# Patient Record
Sex: Female | Born: 1975 | Hispanic: Yes | Marital: Married | Smoking: Never smoker
Health system: Southern US, Community
[De-identification: ages and names within clinical notes are randomized; demographics above are authoritative.]

## PROBLEM LIST (undated history)

## (undated) DIAGNOSIS — F410 Panic disorder [episodic paroxysmal anxiety] without agoraphobia: Secondary | ICD-10-CM

## (undated) DIAGNOSIS — F419 Anxiety disorder, unspecified: Secondary | ICD-10-CM

## (undated) HISTORY — PX: TUBAL LIGATION: SHX77

---

## 2006-01-01 ENCOUNTER — Emergency Department: Payer: Self-pay | Admitting: General Practice

## 2006-01-01 IMAGING — CT CT HEAD WITHOUT CONTRAST
2 of 4 series · 17 of 30 positions shown, 20 images · non-contrast
Comparison: none

REASON FOR EXAM: dizziness, near syncope
COMMENTS:

[Series 4: without · axial · non-contrast · 0.39mm/px · z∈[-192,-62]mm · 11 of 32 slices shown, 14 images]
[im 3/32  brain]
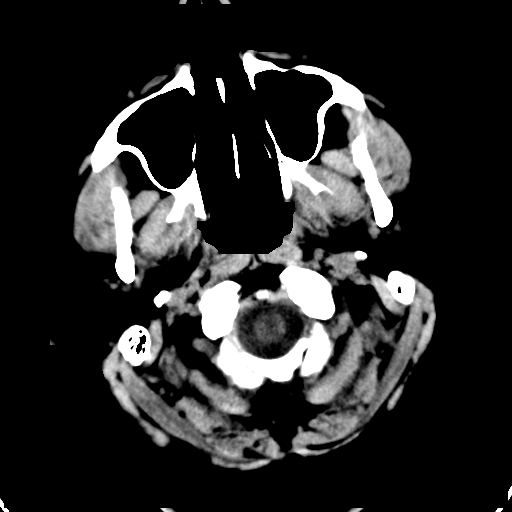
[im 3/32  bone]
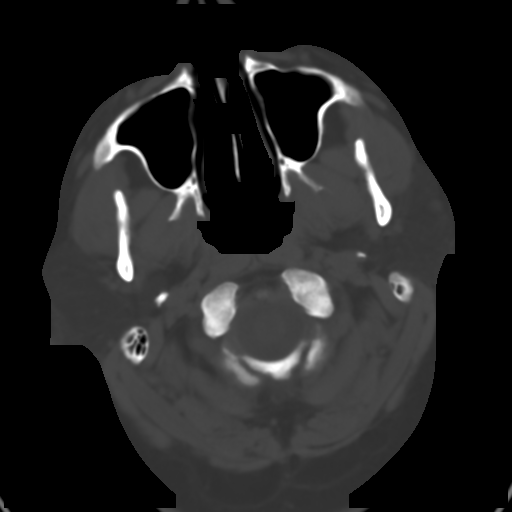
[im 6/32  brain]
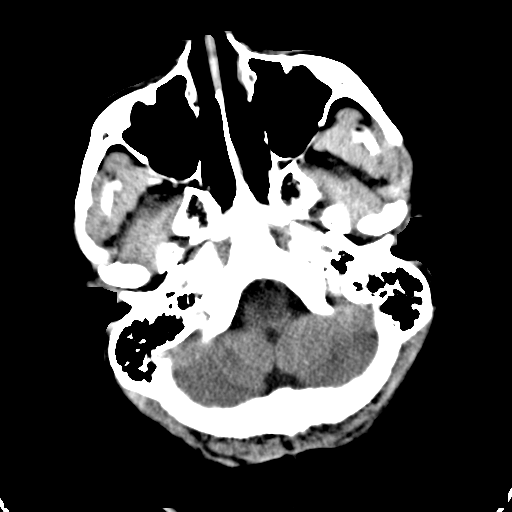
[im 8/32  brain]
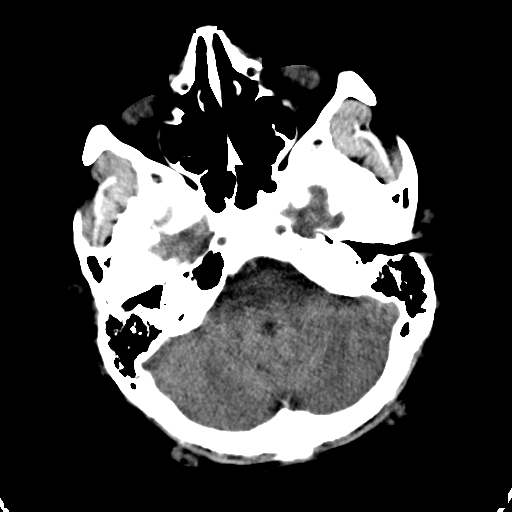
[im 11/32  brain]
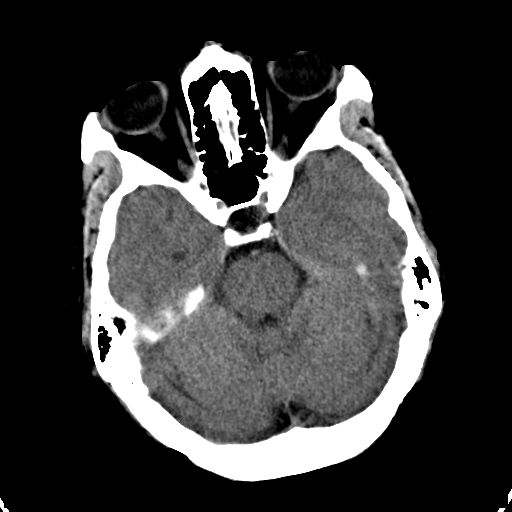
[im 13/32  brain]
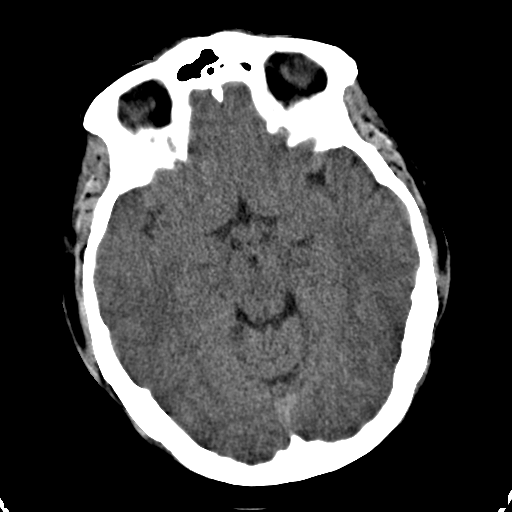
[im 13/32  bone]
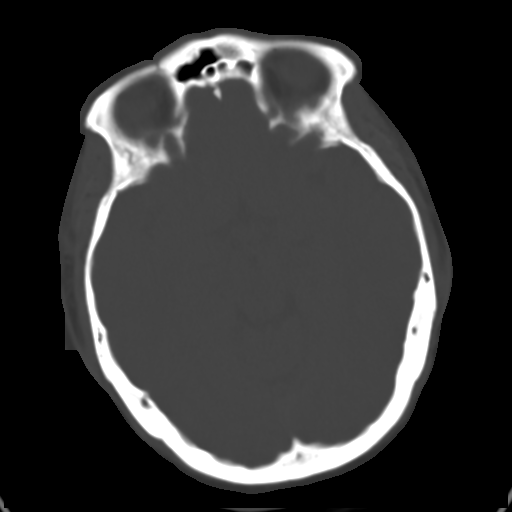
[im 16/32  brain]
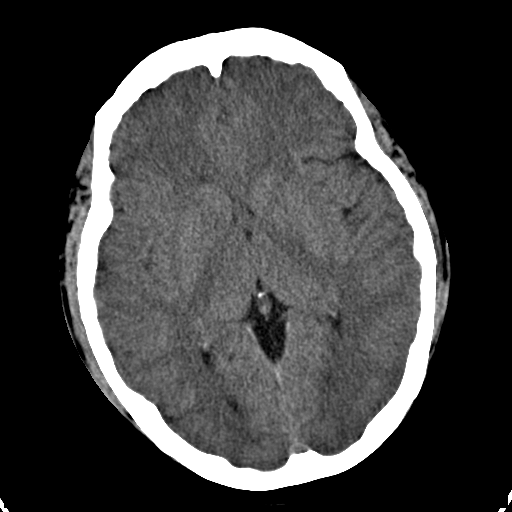
[im 19/32  brain]
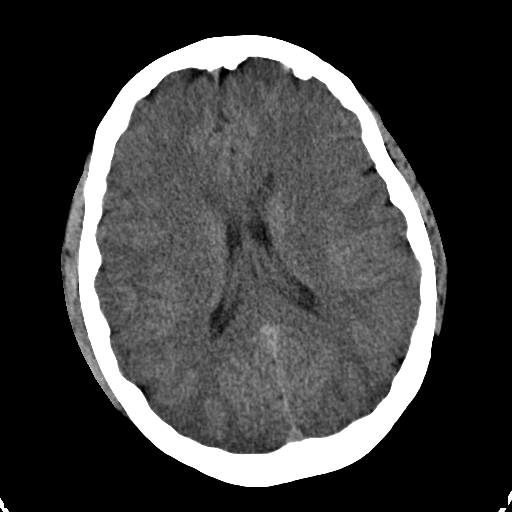
[im 21/32  brain]
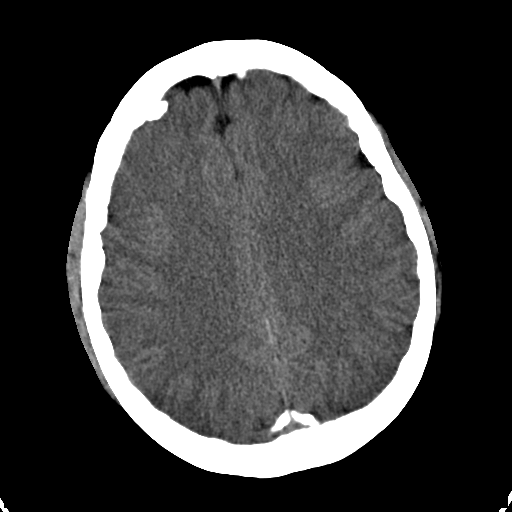
[im 24/32  brain]
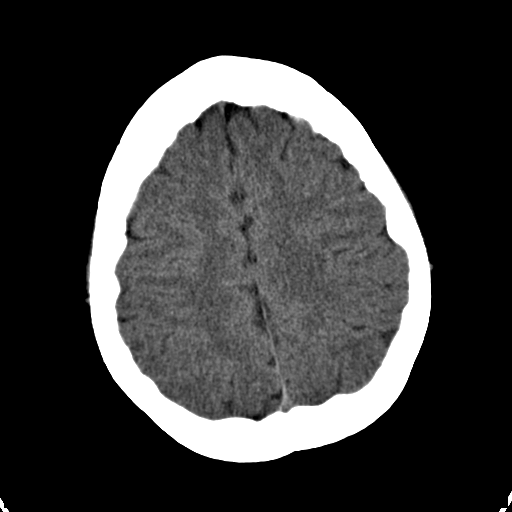
[im 24/32  bone]
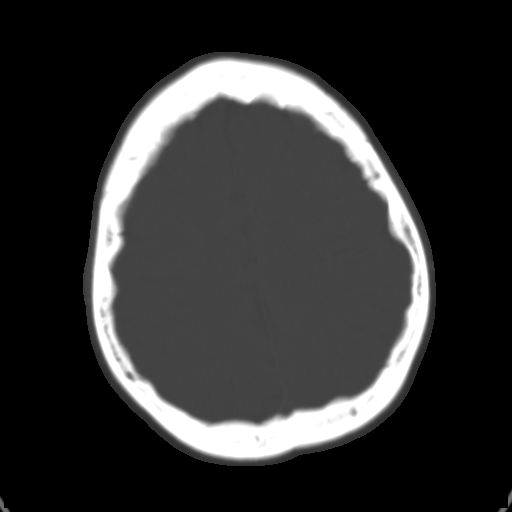
[im 26/32  brain]
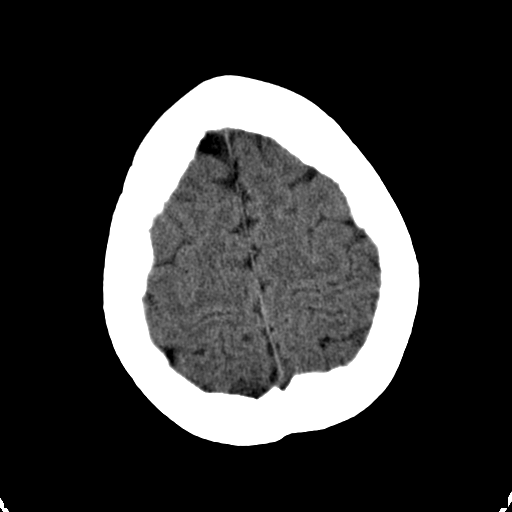
[im 29/32  brain]
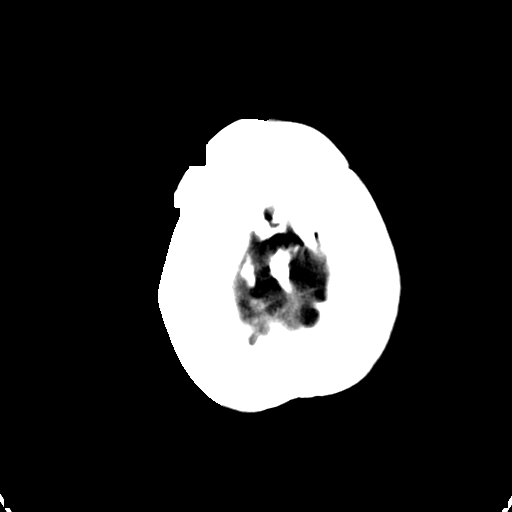

[Series 5: bone · axial · 0.39mm/px · z∈[-192,-102]mm · 6 of 32 slices shown]
[im 3/32  bone]
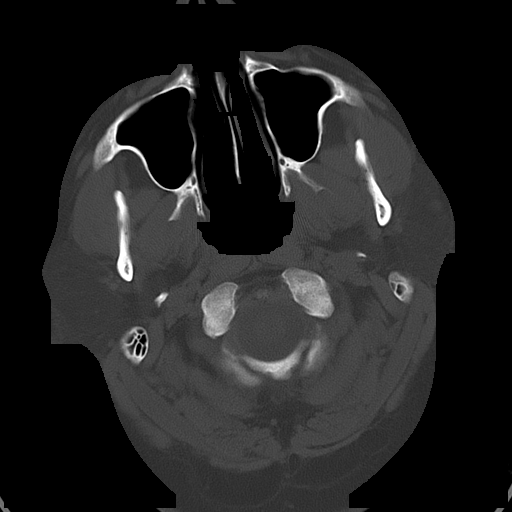
[im 8/32  bone]
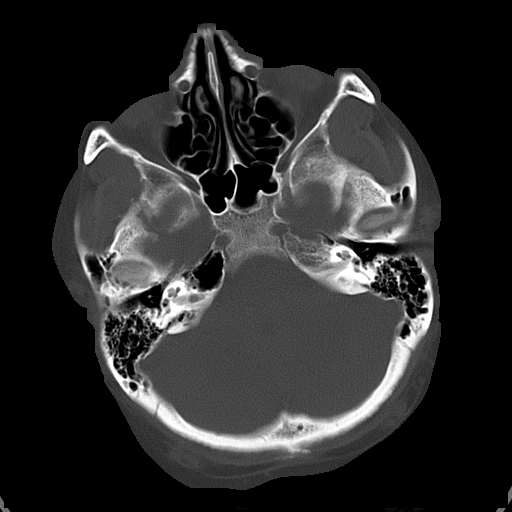
[im 11/32  bone]
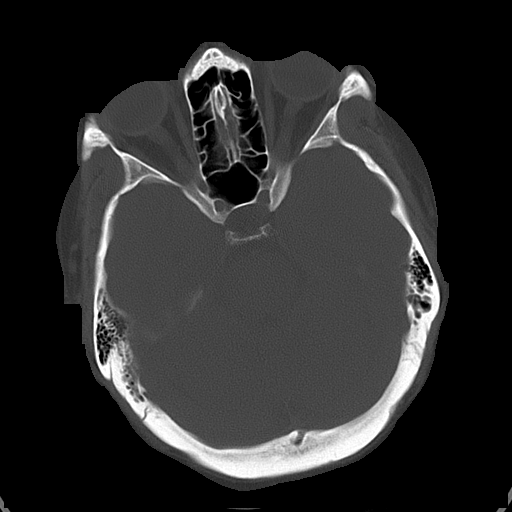
[im 13/32  bone]
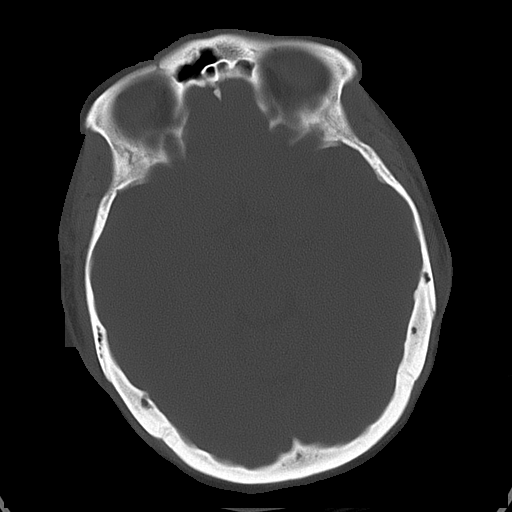
[im 19/32  bone]
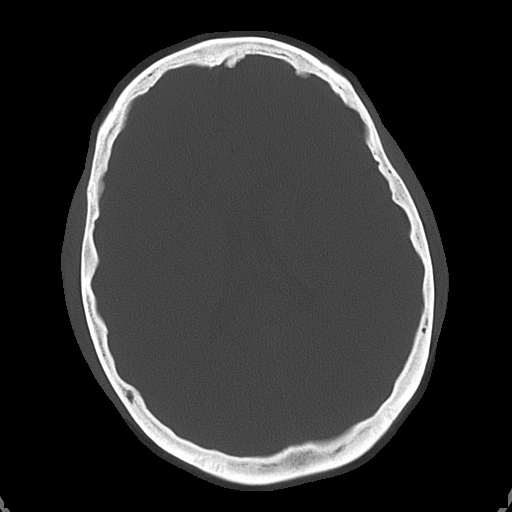
[im 21/32  bone]
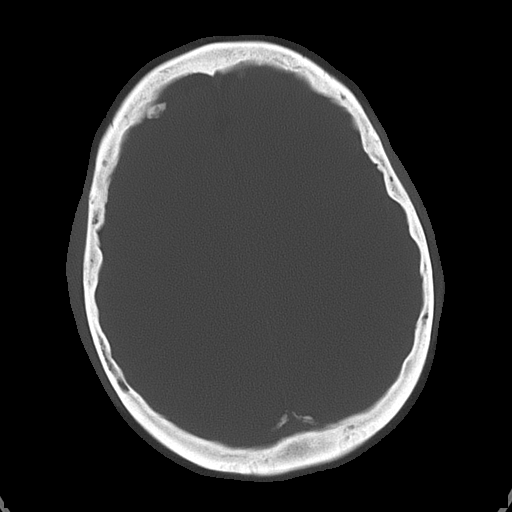

[17 of 30 positions shown; findings below may reference images not displayed]

PROCEDURE:     CT  - CT HEAD WITHOUT CONTRAST  - January 01, 2006  [DATE]

RESULT:          The patient is complaining of two days of headache.

The ventricles are normal in size and position.  There is no intracranial
hemorrhage, mass, or mass effect.  At bone window settings, I do not see
evidence of an acute skull fracture.  I see no air-fluid levels in the
visualized portions of the paranasal sinuses.  No lytic or blastic bony
lesion is seen.
IMPRESSION: I see no acute intracranial abnormality.

The findings were called to the emergency room at the conclusion of the
study.

## 2006-01-01 IMAGING — CR DG CHEST 2V
1 series · 2 of 2 positions shown · non-contrast
Comparison: none

REASON FOR EXAM: shortness of breath
COMMENTS:

[Series 1: view not recorded · 0.17mm/px · 2 of 2 slices shown]
[im 1/2]
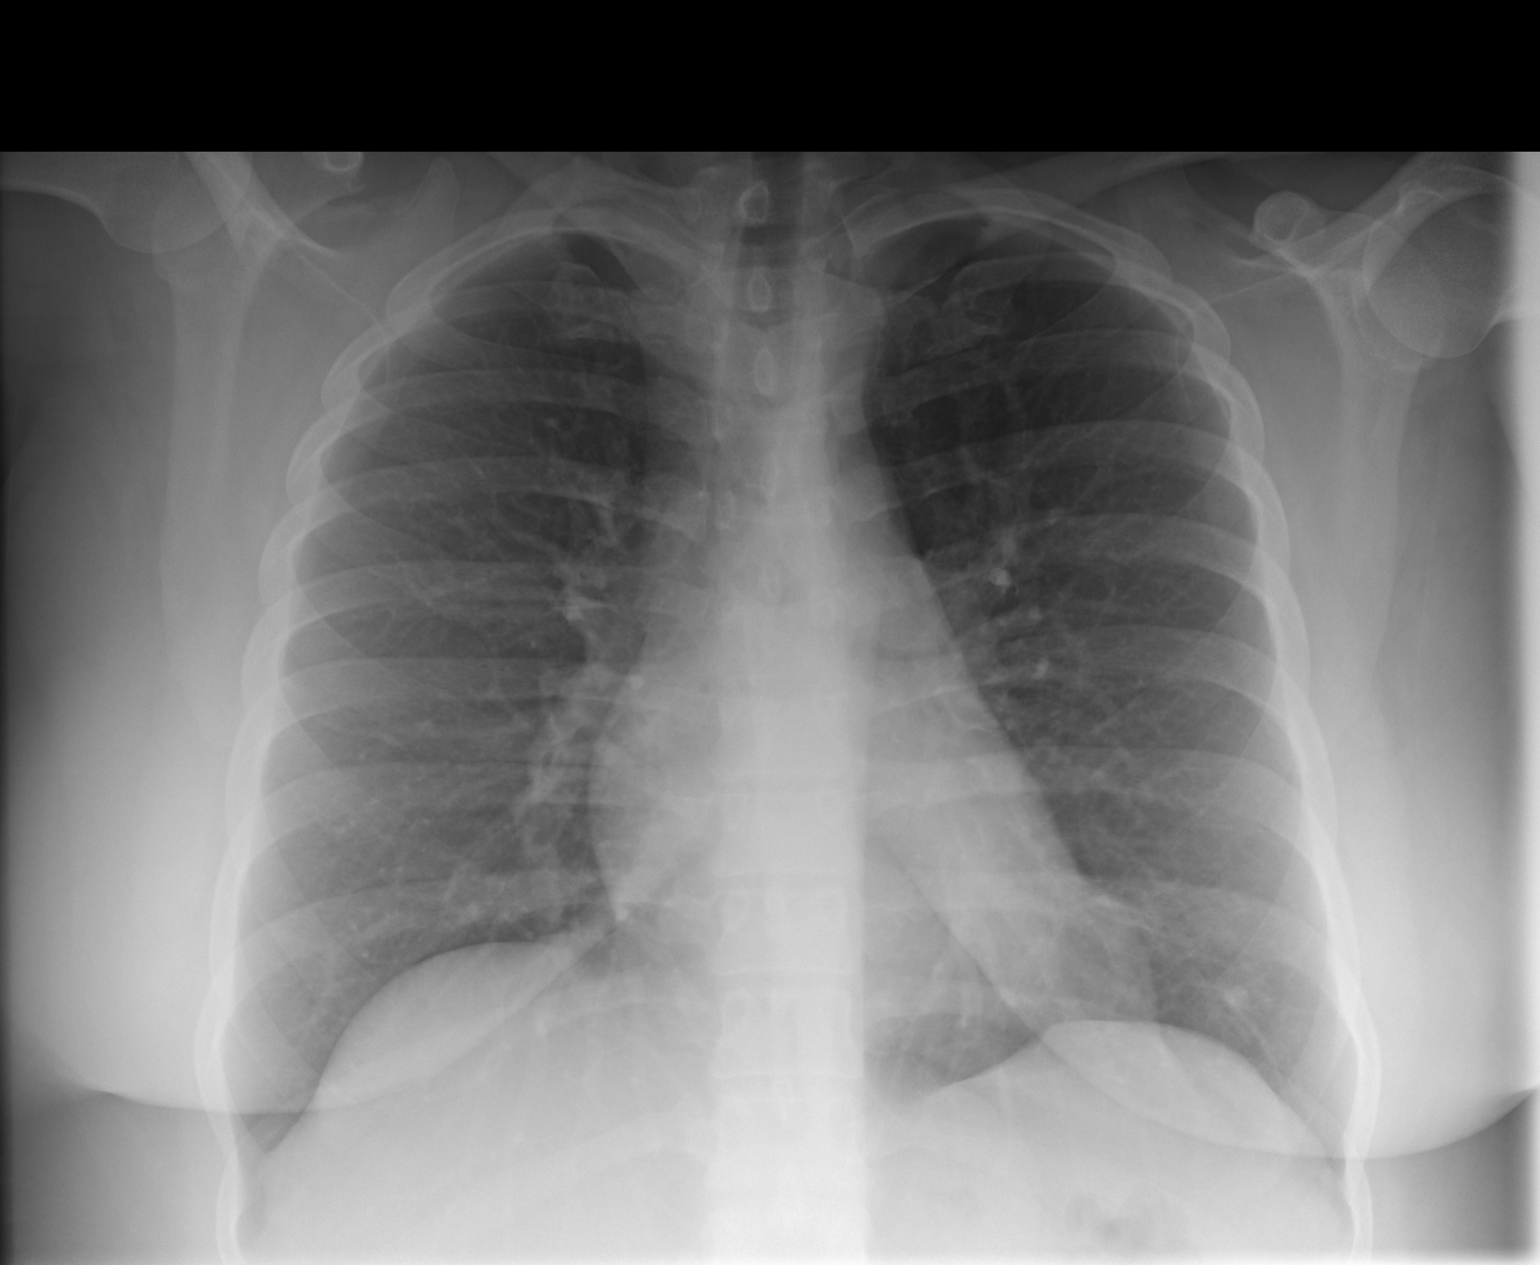
[im 2/2]
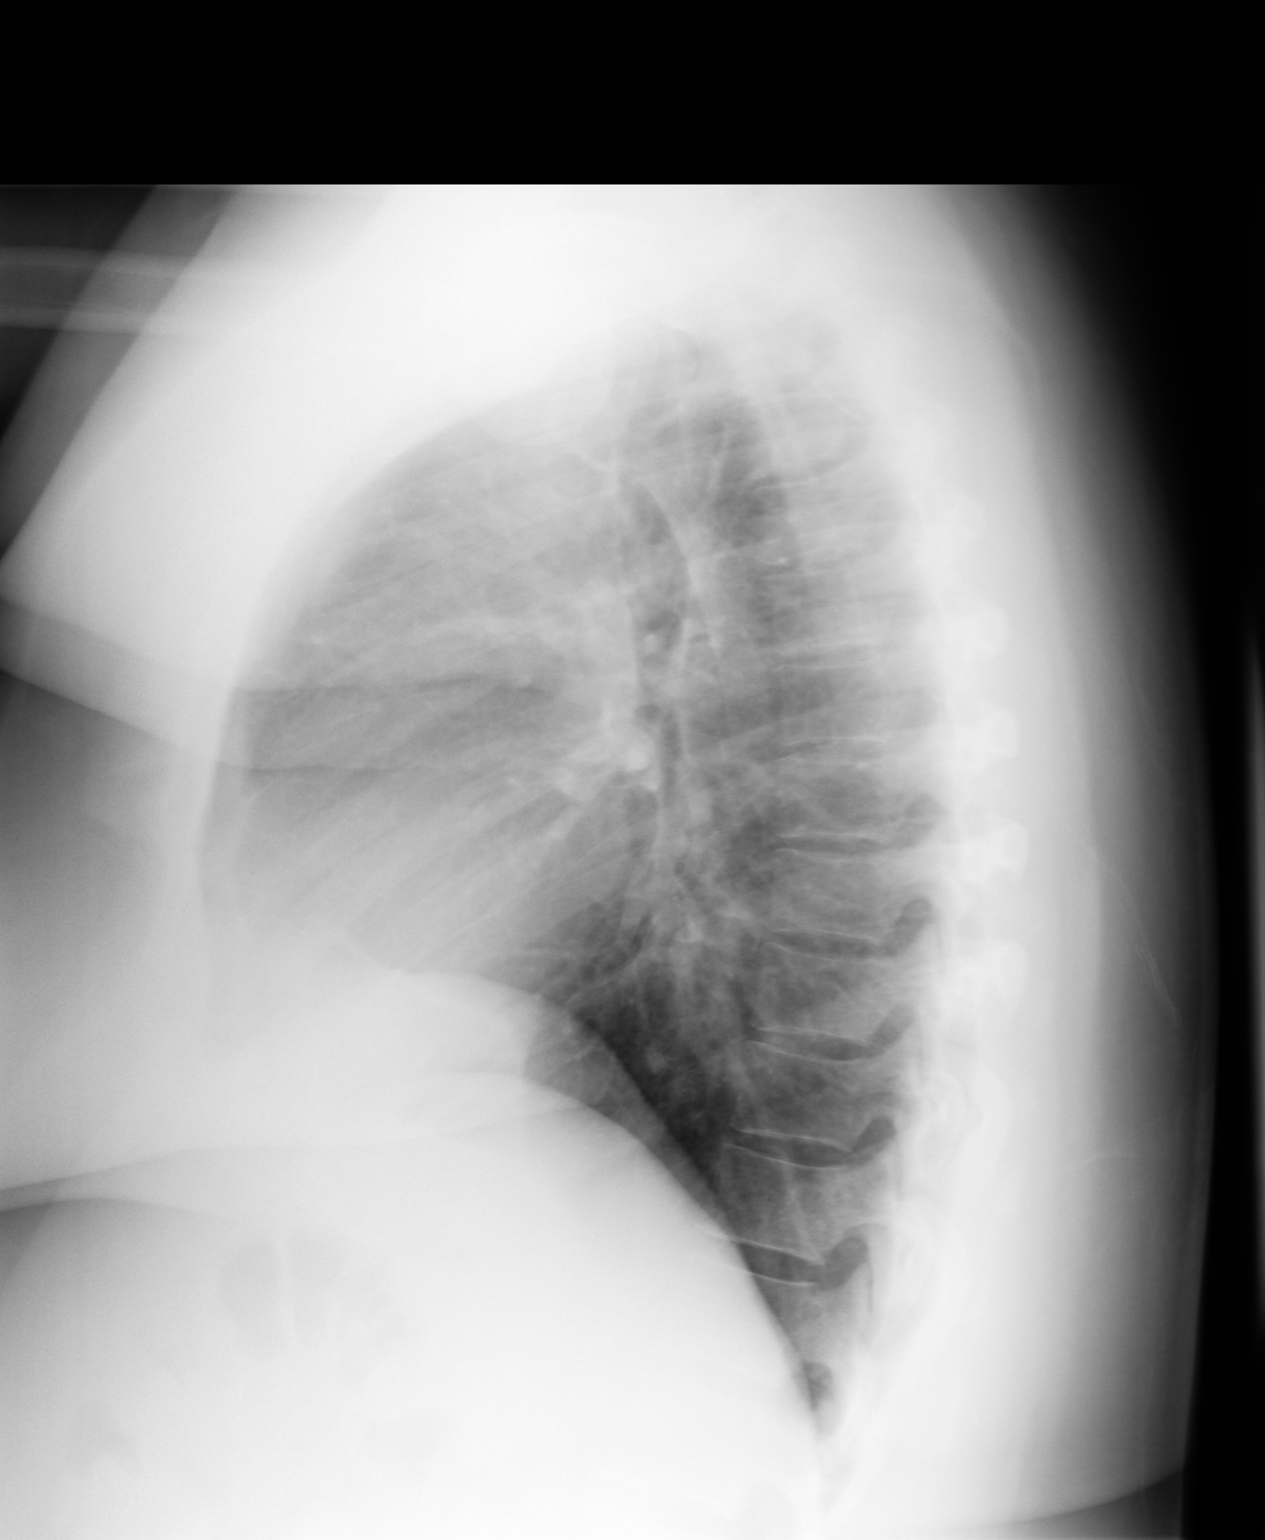

[2 of 2 positions shown; findings below may reference images not displayed]

PROCEDURE:     DXR - DXR CHEST PA (OR AP) AND LATERAL  - January 01, 2006  [DATE]

RESULT:          The lungs are adequately inflated.  There is no focal
infiltrate.  A radiodensity projecting in the LEFT lower lobe is most
compatible with a granuloma.  The heart is not enlarged, and the pulmonary
vascularity is not engorged.
IMPRESSION: I do not see evidence of congestive heart failure, nor
pneumonia, nor other acute cardiopulmonary disease.  There are findings
which likely reflect prior granulomatous infection.

## 2006-11-05 ENCOUNTER — Other Ambulatory Visit: Payer: Self-pay

## 2006-11-05 ENCOUNTER — Emergency Department: Payer: Self-pay | Admitting: Emergency Medicine

## 2006-12-31 ENCOUNTER — Emergency Department: Payer: Self-pay | Admitting: Emergency Medicine

## 2006-12-31 IMAGING — CR CERVICAL SPINE - COMPLETE 4+ VIEW
1 series · 6 of 6 positions shown · non-contrast
Comparison: none

REASON FOR EXAM: Left sided pain with radicular symtoms to left arm
COMMENTS:

[Series 1: view not recorded · 0.17mm/px · 6 of 6 slices shown]
[im 1/6]
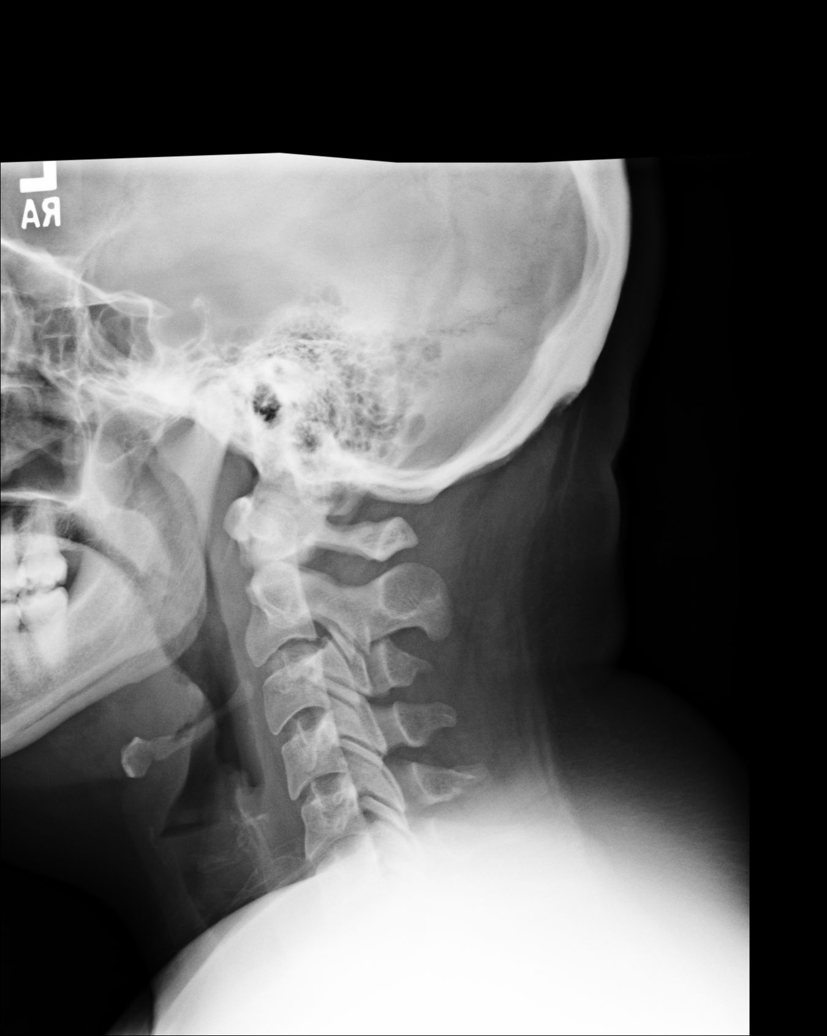
[im 2/6]
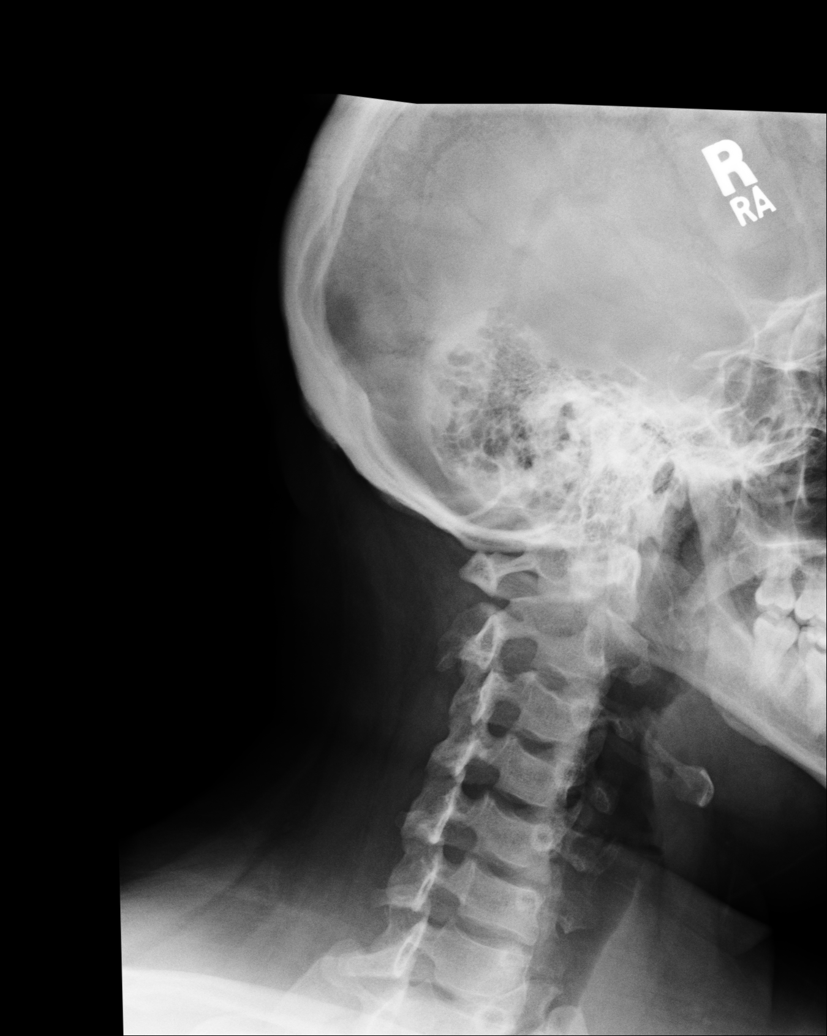
[im 3/6]
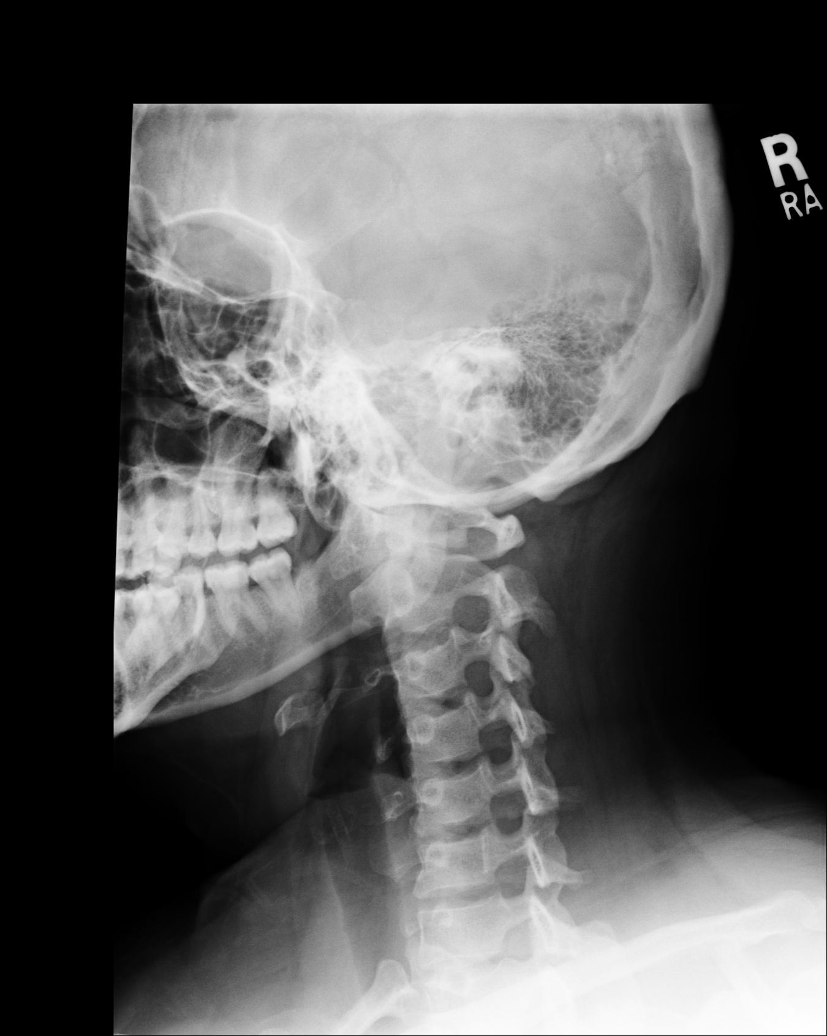
[im 4/6]
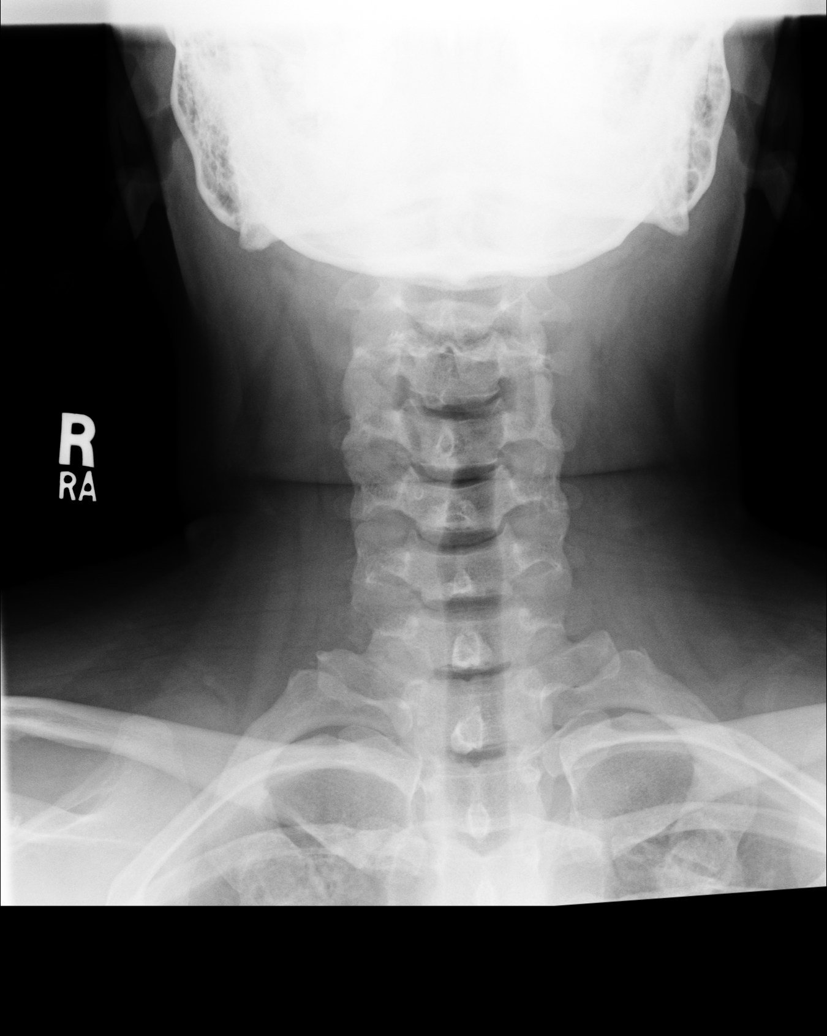
[im 5/6]
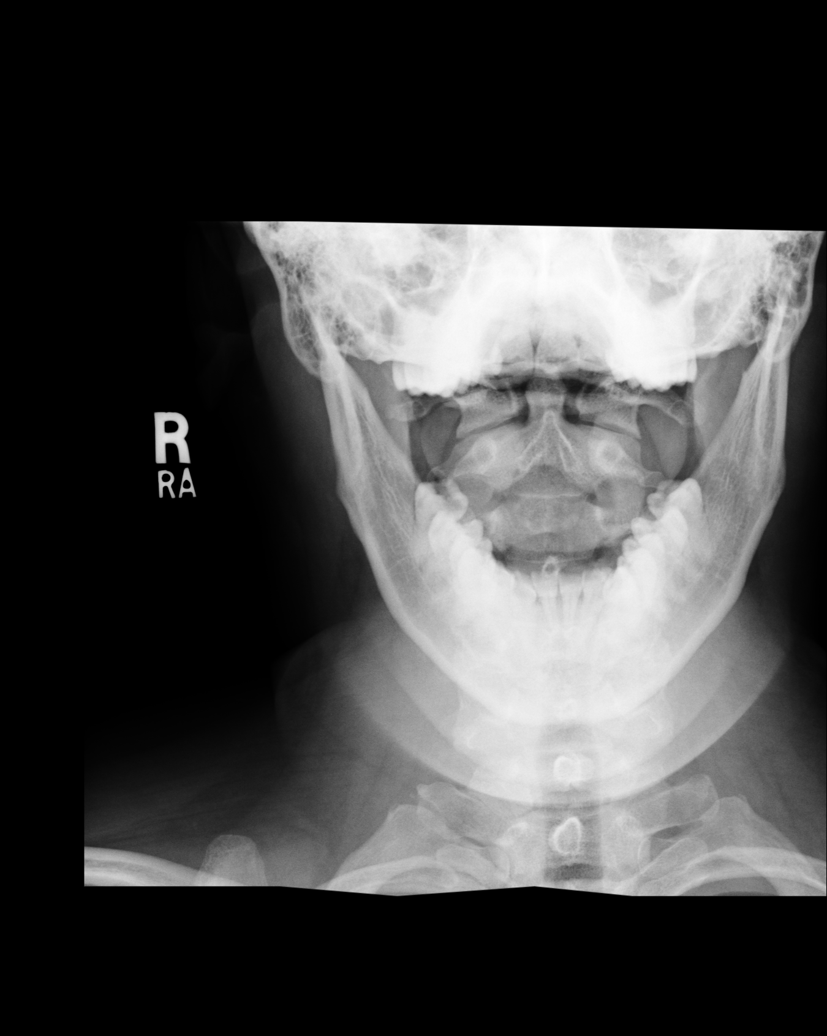
[im 6/6]
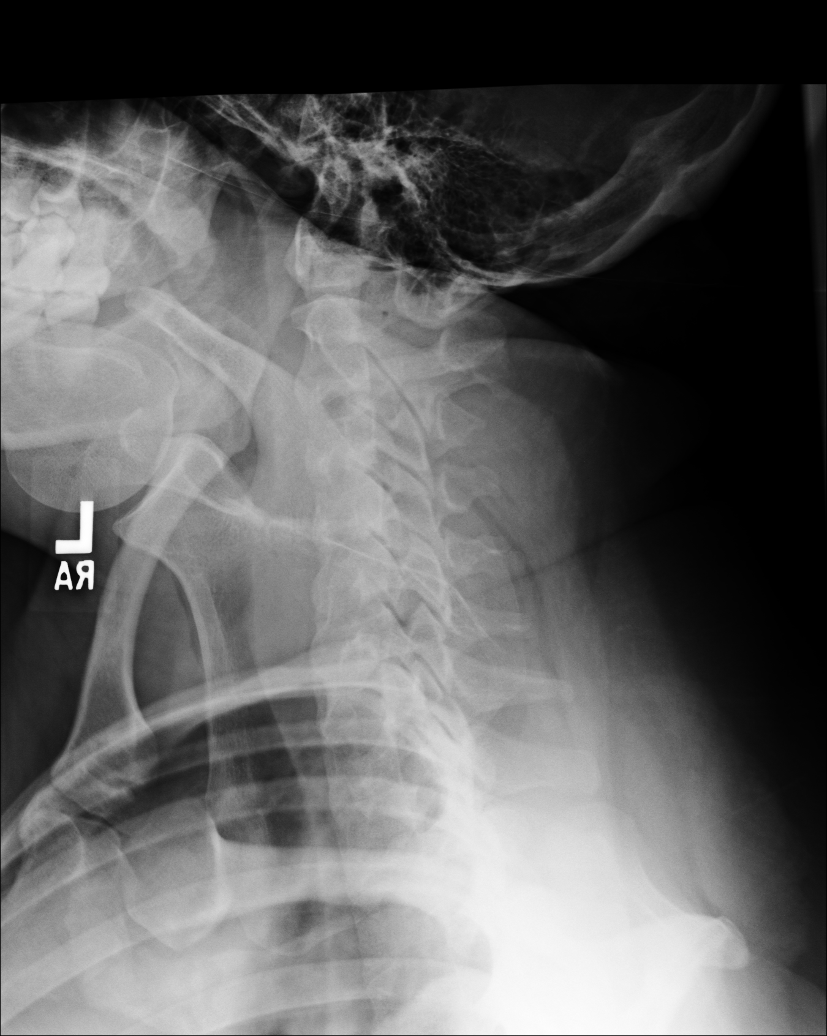

[6 of 6 positions shown; findings below may reference images not displayed]

PROCEDURE:     DXR - DXR CERVICAL SPINE COMPLETE  - December 31, 2006  [DATE]

RESULT:     Images of the cervical spine demonstrate loss of the normal
cervical lordosis. The odontoid is intact. The cervicothoracic region and
craniocervical junction appear to be normal. There is loss of the normal
cervical lordosis which can be seen in muscle spasm. The prevertebral soft
tissues are normal.
IMPRESSION: No evidence of fracture.

## 2008-10-29 ENCOUNTER — Emergency Department: Payer: Self-pay | Admitting: Emergency Medicine

## 2009-10-25 ENCOUNTER — Emergency Department: Payer: Self-pay | Admitting: Emergency Medicine

## 2009-10-25 IMAGING — CR RIGHT ELBOW - COMPLETE 3+ VIEW
1 series · 4 of 4 positions shown · non-contrast
Comparison: none

REASON FOR EXAM: fall / pain / decreased movement
COMMENTS:   May transport without cardiac monitor

[Series 1: view not recorded · 0.17mm/px · 4 of 4 slices shown]
[im 1/4]
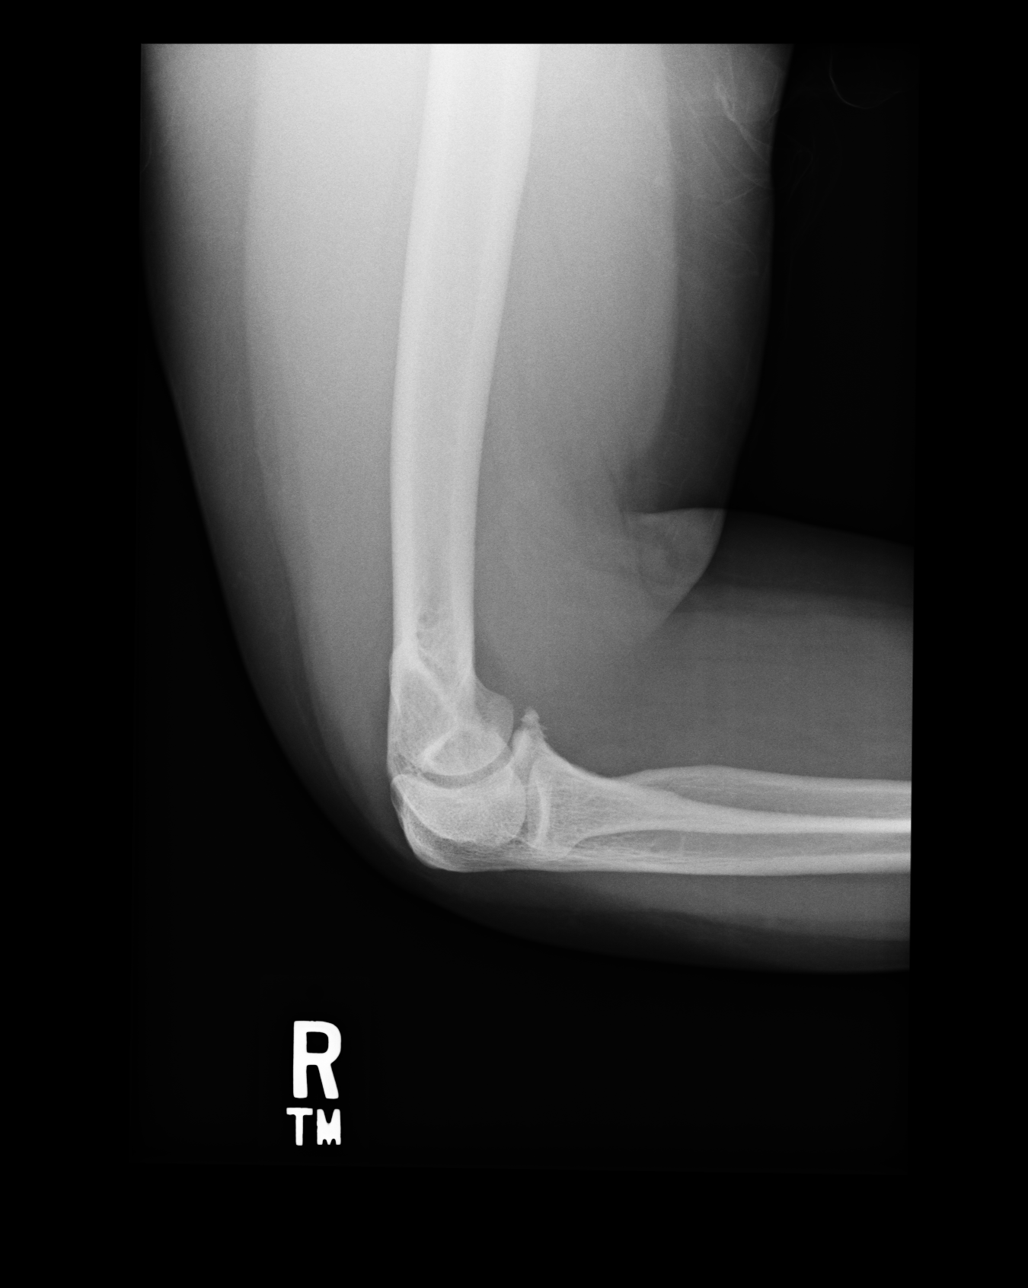
[im 2/4]
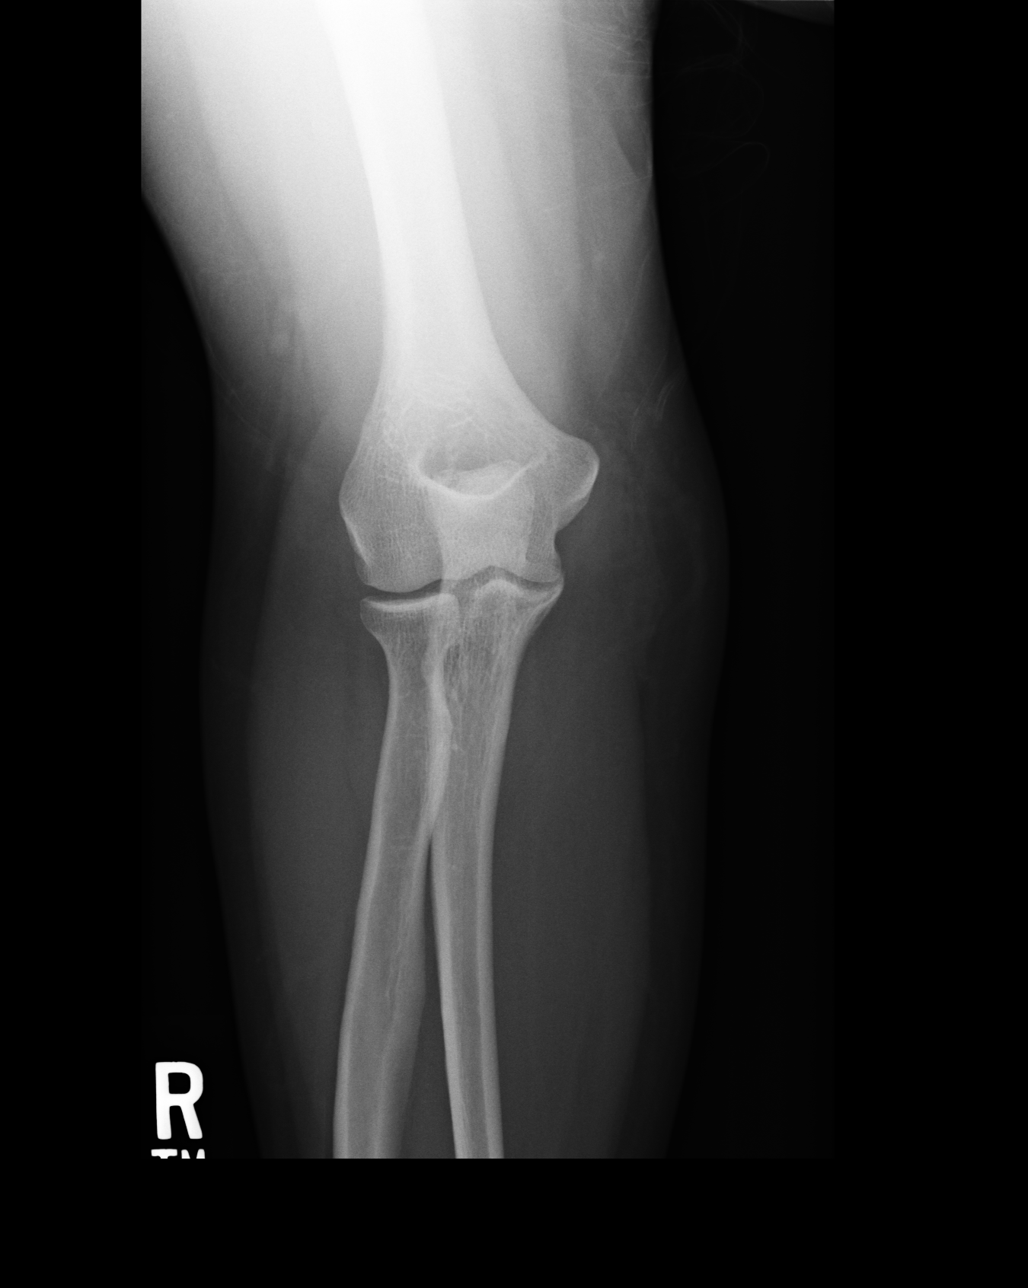
[im 3/4]
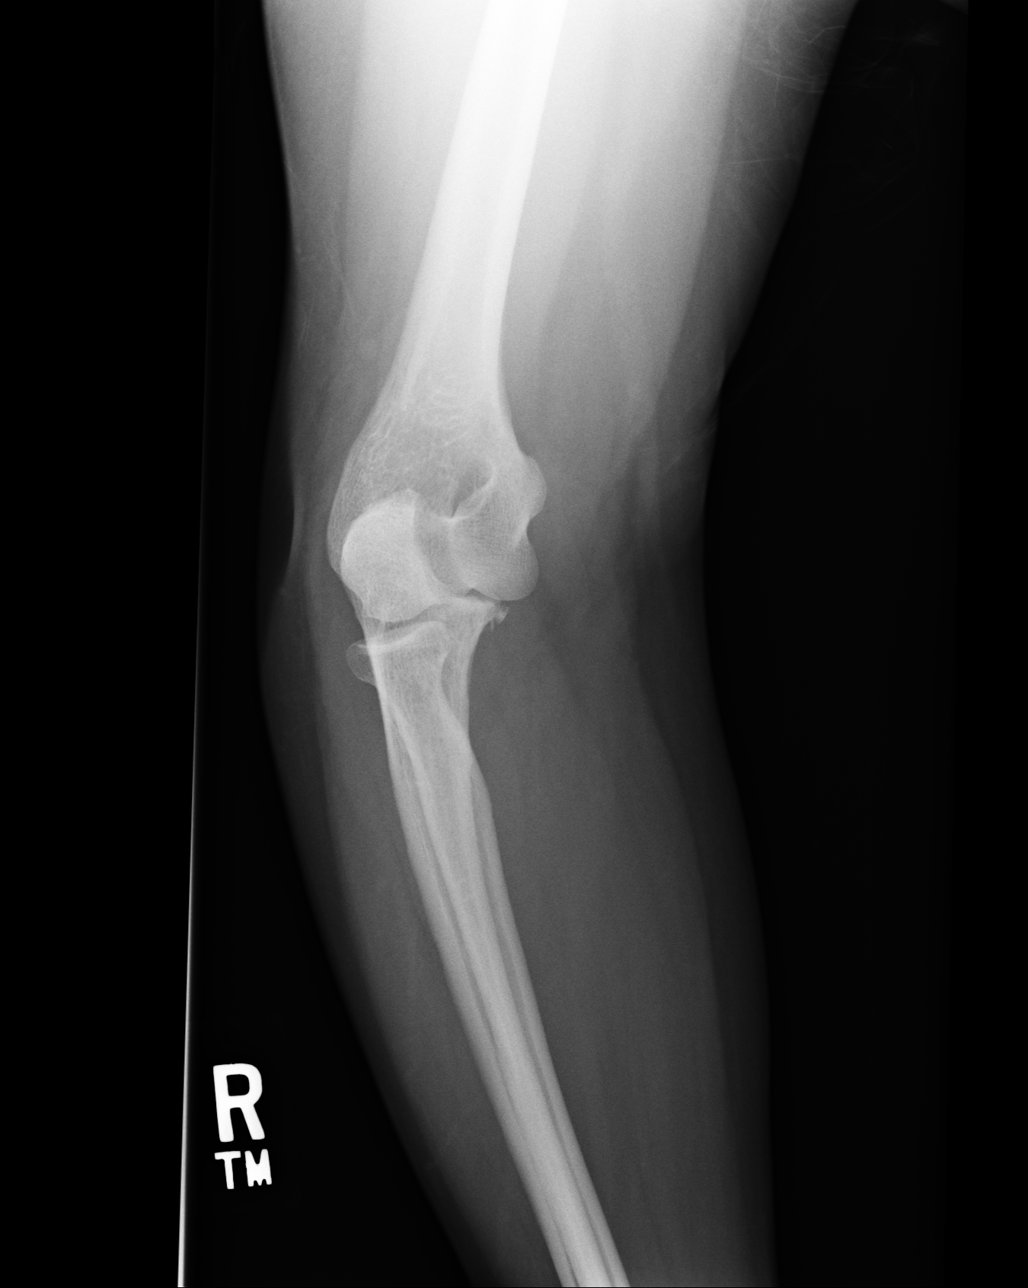
[im 4/4]
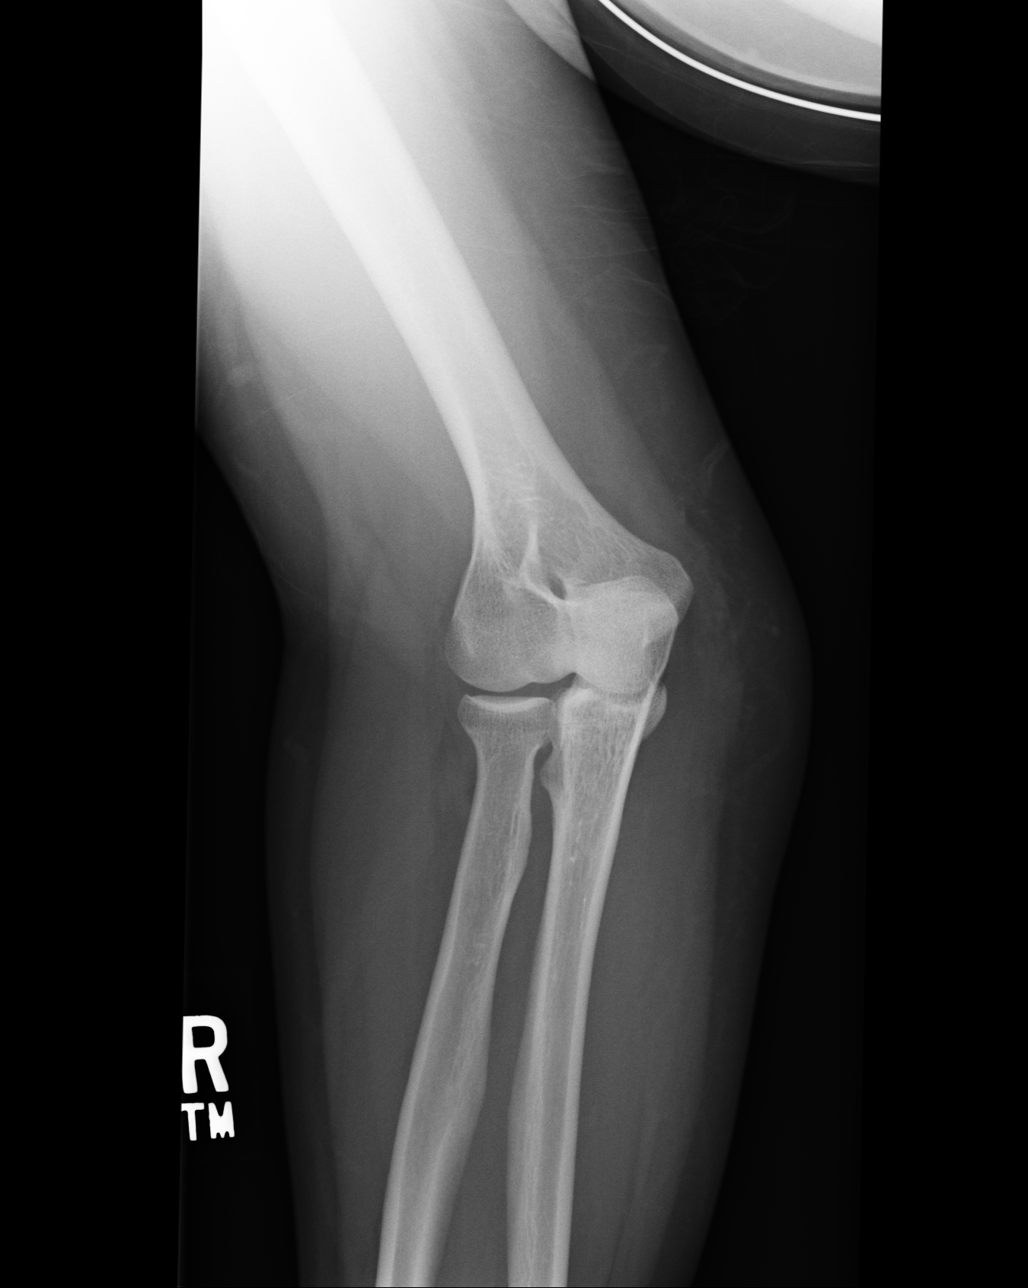

[4 of 4 positions shown; findings below may reference images not displayed]

PROCEDURE:     DXR - DXR ELBOW RT COMP W/OBLIQUES  - October 25, 2009  [DATE]

RESULT:     Four views of the right elbow are submitted. The radial head is
intact. I see no supracondylar fracture. There is a small joint effusion.
There is irregularity of the coronoid process of the olecranon. This may be
degenerative but a tiny avulsion fracture cannot be excluded.
IMPRESSION: I cannot exclude an avulsion from the coronoid of the
olecranon. There is a small joint effusion. I see no radial head fracture or
supracondylar fracture of the humerus.

## 2011-10-26 ENCOUNTER — Emergency Department: Payer: Self-pay | Admitting: *Deleted

## 2011-10-26 LAB — CBC
HCT: 38.9 % (ref 35.0–47.0)
MCH: 29.1 pg (ref 26.0–34.0)
MCHC: 34.2 g/dL (ref 32.0–36.0)
MCV: 85 fL (ref 80–100)
RDW: 13.6 % (ref 11.5–14.5)

## 2011-10-26 LAB — COMPREHENSIVE METABOLIC PANEL
Albumin: 3.4 g/dL (ref 3.4–5.0)
Anion Gap: 7 (ref 7–16)
BUN: 12 mg/dL (ref 7–18)
Bilirubin,Total: 0.3 mg/dL (ref 0.2–1.0)
Calcium, Total: 8 mg/dL — ABNORMAL LOW (ref 8.5–10.1)
Co2: 27 mmol/L (ref 21–32)
Creatinine: 0.61 mg/dL (ref 0.60–1.30)
EGFR (African American): >60
EGFR (Non-African Amer.): >60
Glucose: 95 mg/dL (ref 65–99)
Osmolality: 283 (ref 275–301)
SGOT(AST): 17 U/L (ref 15–37)
Sodium: 142 mmol/L (ref 136–145)

## 2011-10-26 LAB — URINALYSIS, COMPLETE
Bilirubin,UR: NEGATIVE
Glucose,UR: NEGATIVE mg/dL (ref 0–75)
Protein: 30
WBC UR: 12 /HPF (ref 0–5)

## 2011-10-26 IMAGING — CR DG CHEST 2V
1 series · 3 of 3 positions shown · non-contrast
Comparison: none

REASON FOR EXAM: chest pain, shortness of breath
COMMENTS:   May transport without cardiac monitor

PROCEDURE:     DXR - DXR CHEST PA (OR AP) AND LATERAL  - October 26, 2011  [DATE]
RESULT:     The lung fields are clear. The heart, mediastinal osseous
structures show no significant abnormalities.

[Series 1: w chest pa · 0.14mm/px · 3 of 3 slices shown]
[im 1/3]
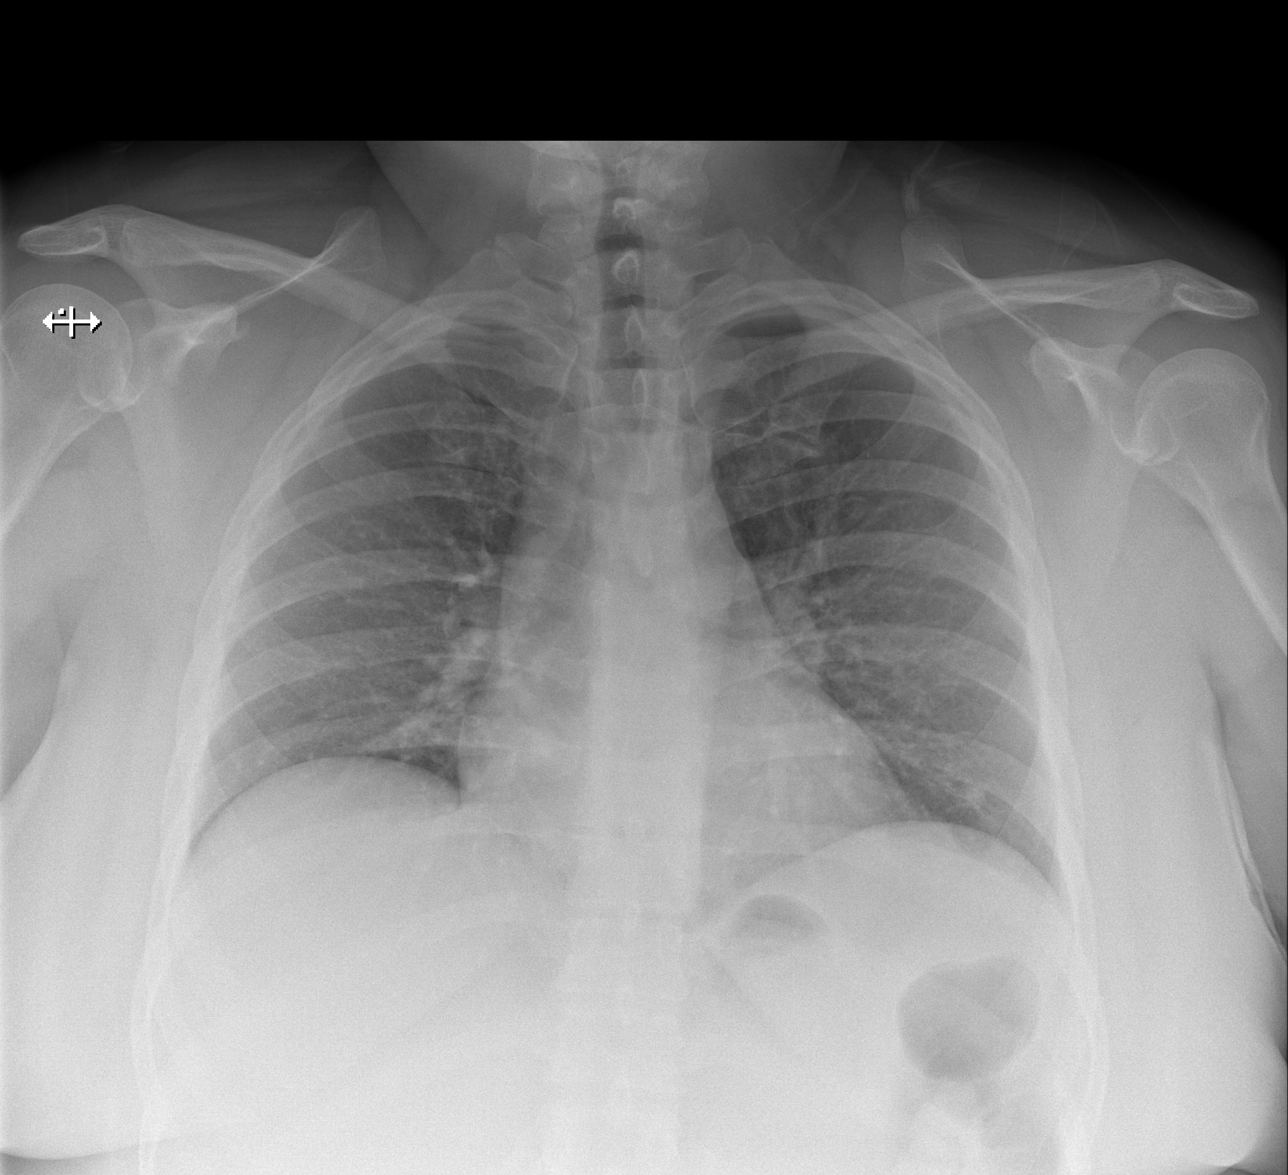
[im 2/3]
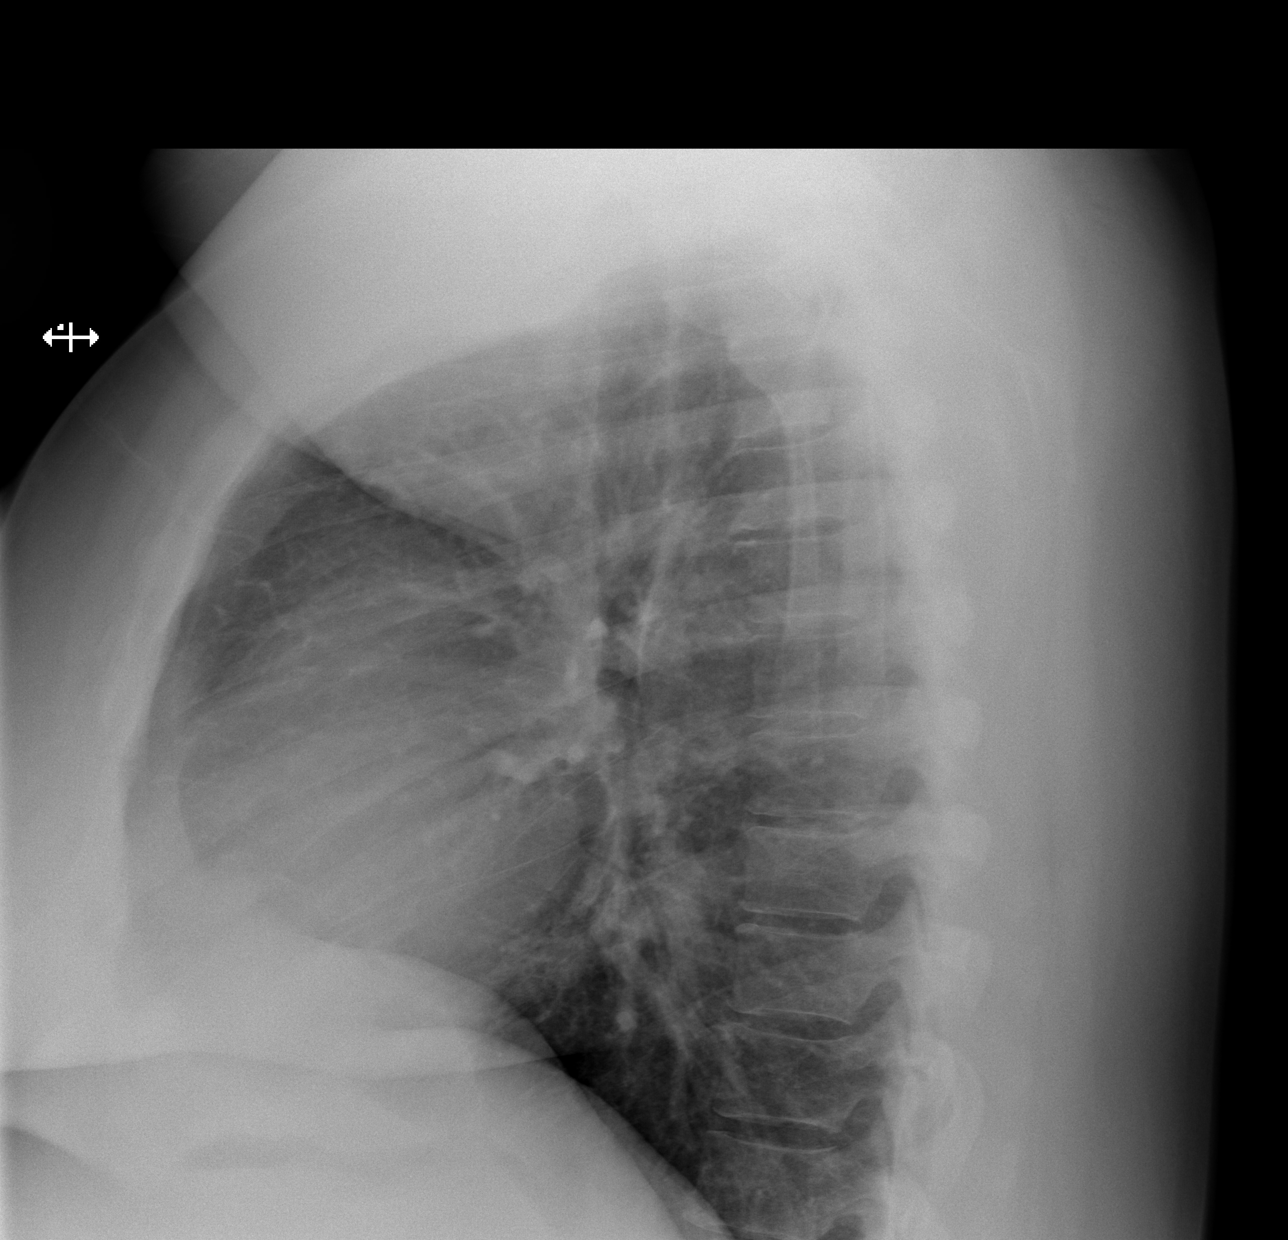
[im 3/3]
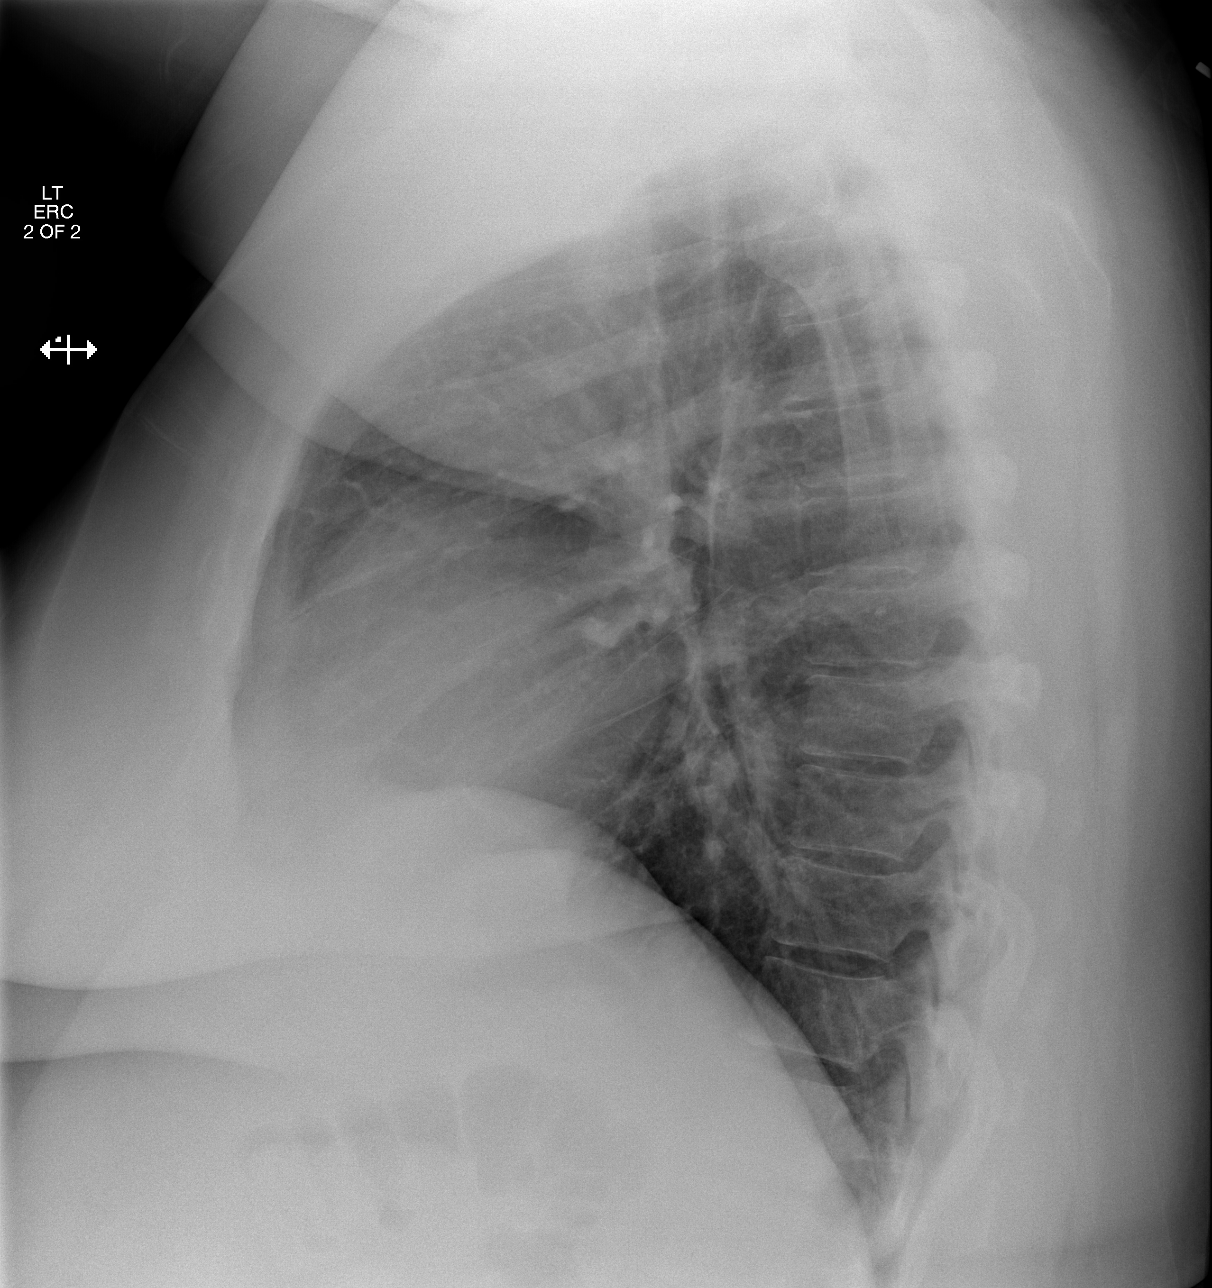

[3 of 3 positions shown; findings below may reference images not displayed]

IMPRESSION: No significant abnormalities are noted.

 Dictation site #2.

## 2012-03-07 ENCOUNTER — Ambulatory Visit: Payer: Self-pay | Admitting: Family Medicine

## 2012-03-07 IMAGING — US US PELV - US TRANSVAGINAL
1 series · 14 of 25 positions shown · non-contrast
Comparison: none

REASON FOR EXAM: Irregular Menses
COMMENTS:

PROCEDURE:     US  - US PELVIS EXAM W/TRANSVAGINAL  - March 07, 2012 [DATE]
RESULT:     Comparison: None.
TECHNIQUE: Multiple grayscale and color Doppler images were obtained of the
pelvis via transabdominal and endovaginal ultrasound.

[Series 1: us pelv - us transvaginal · 0.31mm/px · 14 of 63 slices shown]
[im 1/63]
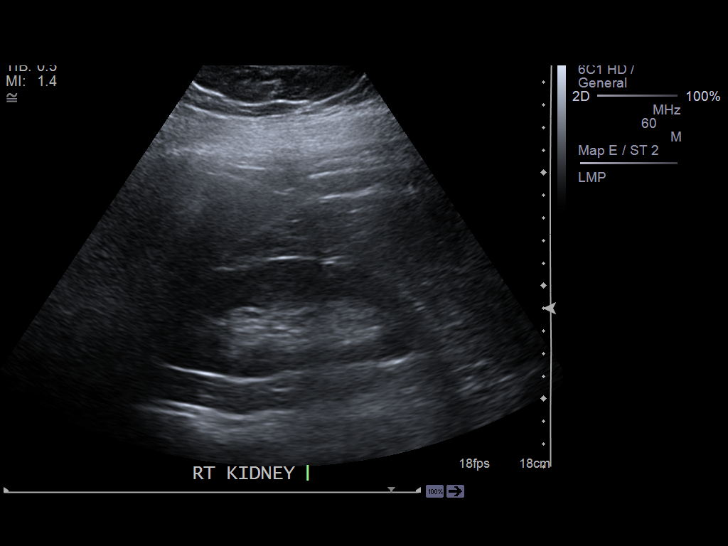
[im 6/63]
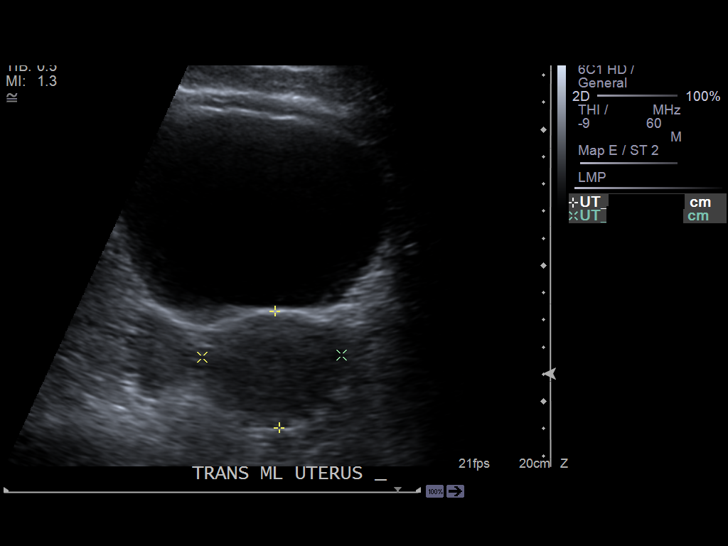
[im 11/63]
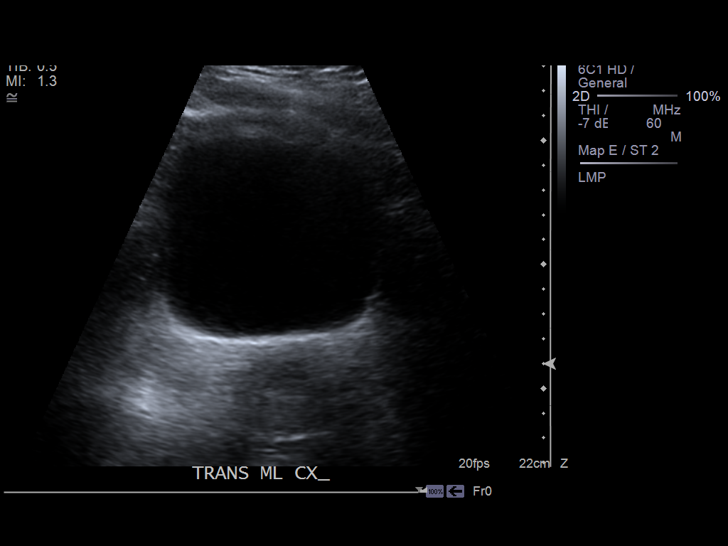
[im 16/63]
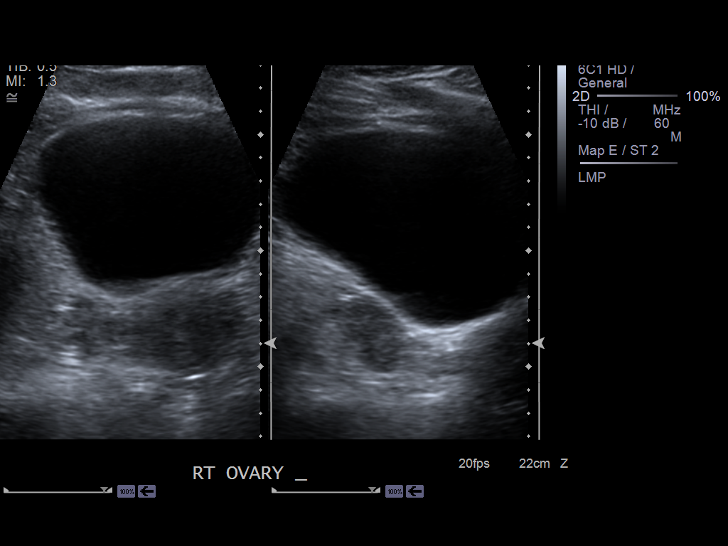
[im 21/63]
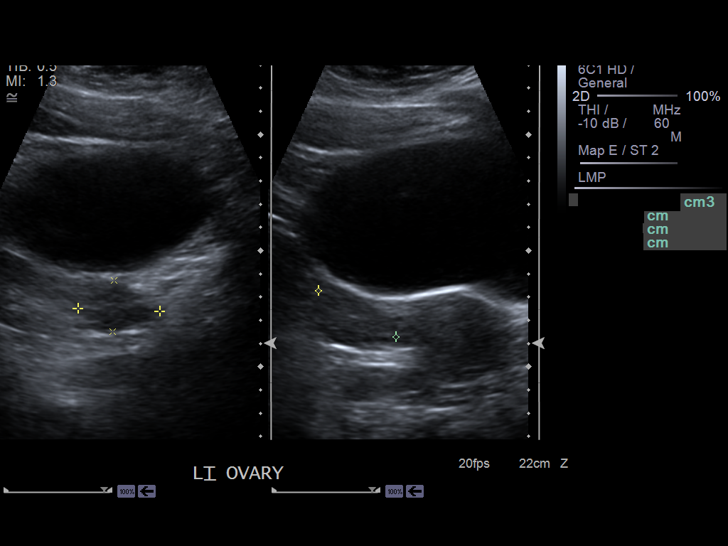
[im 24/63]
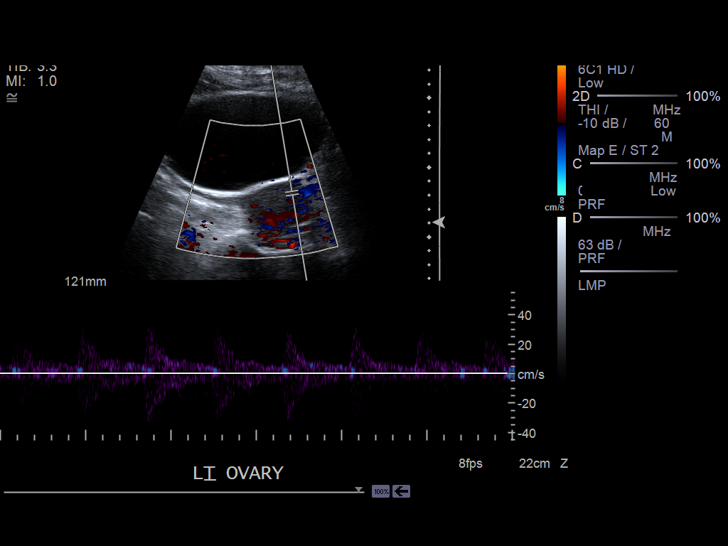
[im 29/63]
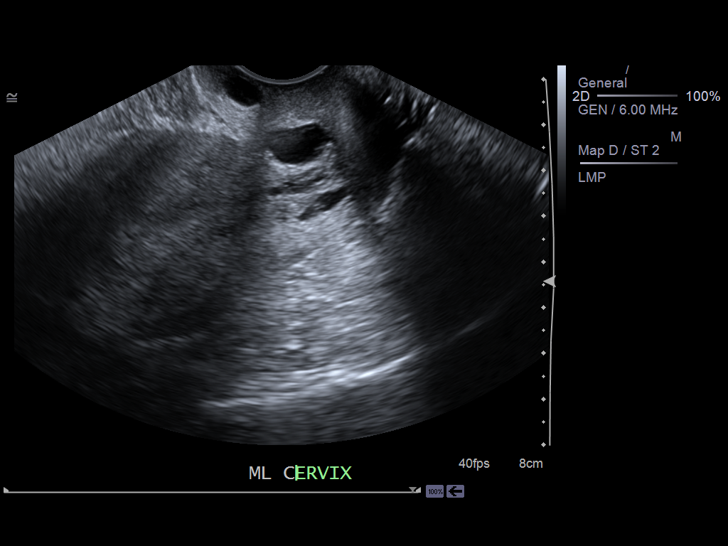
[im 34/63]
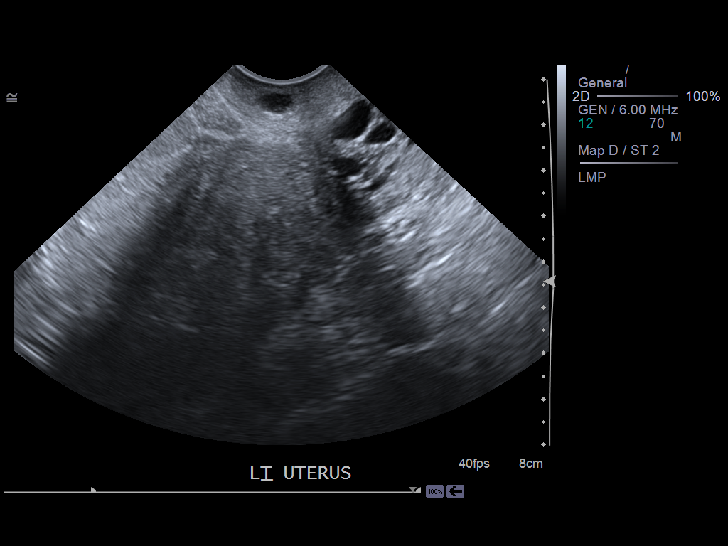
[im 39/63]
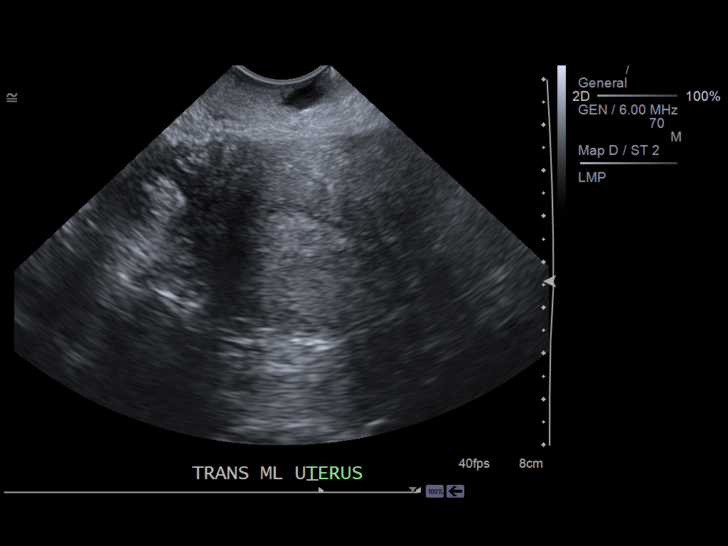
[im 42/63]
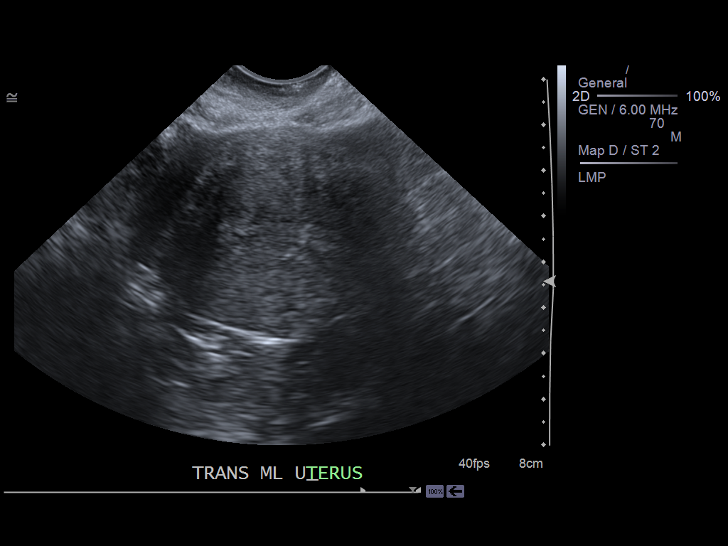
[im 47/63]
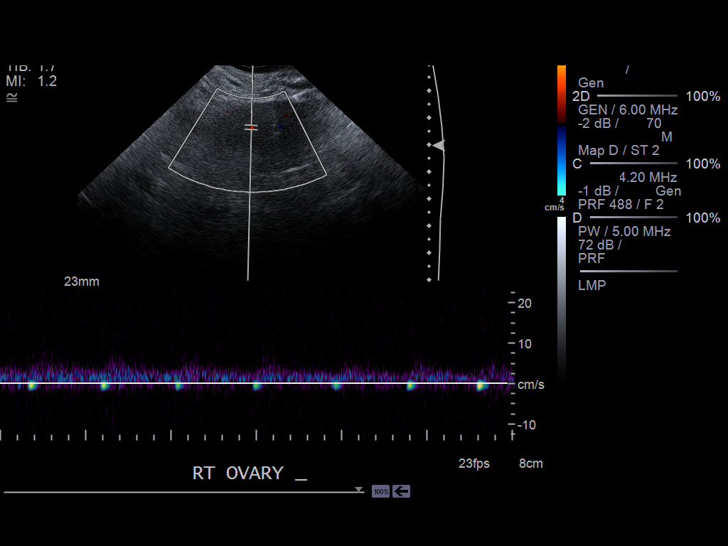
[im 52/63]
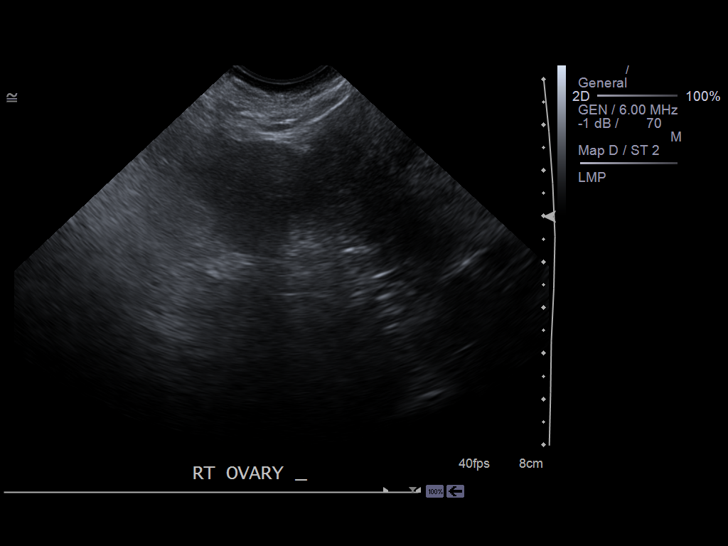
[im 57/63]
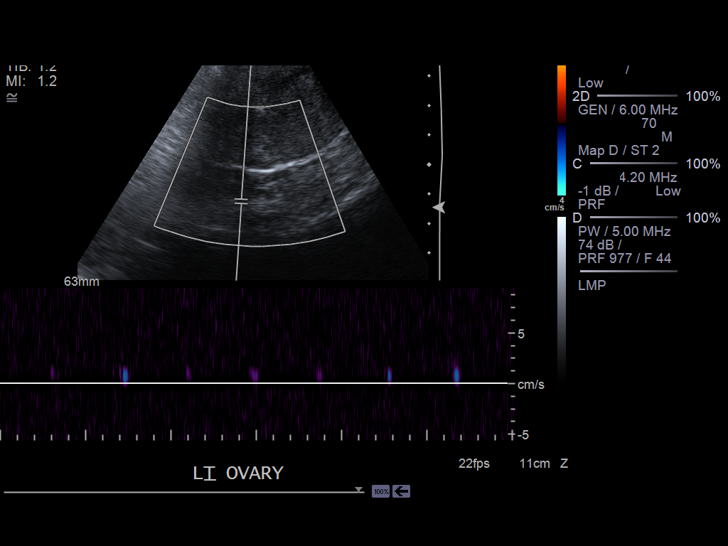
[im 63/63]
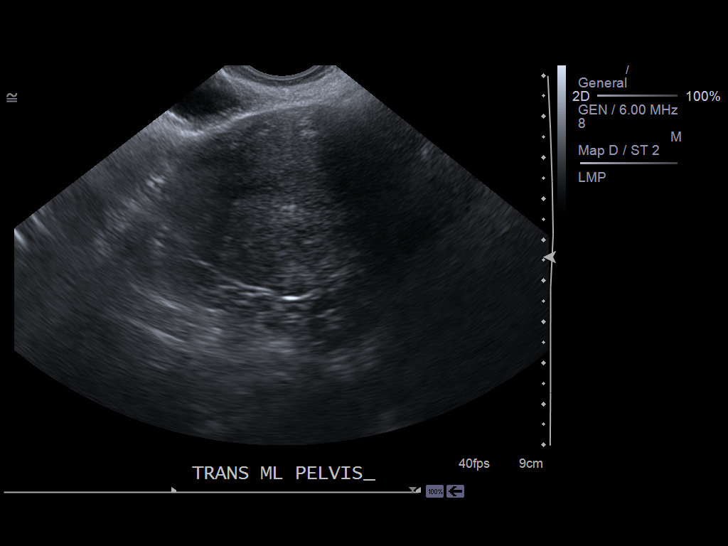

[14 of 25 positions shown; findings below may reference images not displayed]

FINDINGS: The uterus measures 8.7 x 5.1 x 4.3 cm. The endometrial stripe measures 3 mm
in thickness. Several nabothian cysts are present within the cervix. The
largest measures 1.3 cm in greatest dimension.

The right ovary measures 3.3 x 2.5 x 2.3 cm. The left ovary measures 2.6 x
2.0 x 2.0 cm. Arterial and venous Spector Doppler waveforms are demonstrated
in the bilateral ovaries. No adnexal mass identified.
IMPRESSION: No acute findings. Endometrial stripe is within normal limits.

[REDACTED]

## 2012-10-26 ENCOUNTER — Ambulatory Visit: Payer: Self-pay

## 2012-10-26 IMAGING — CR RIGHT FOOT COMPLETE - 3+ VIEW
1 series · 3 of 3 positions shown · non-contrast
Comparison: none

REASON FOR EXAM: foot pain Dr. Maria R Hoskins FNP
COMMENTS:

PROCEDURE:     DXR - DXR FOOT RT COMPLETE W/OBLIQUES  - October 26, 2012  [DATE]
RESULT:     Right foot images demonstrate probable accessory ossicle
adjacent to the cuboid. Small chronic avulsion is felt to be less likely. No
definite acute fracture, dislocation or foreign body is appreciated.

[Series 1: ap · 0.17mm/px · 3 of 3 slices shown]
[im 1/3]
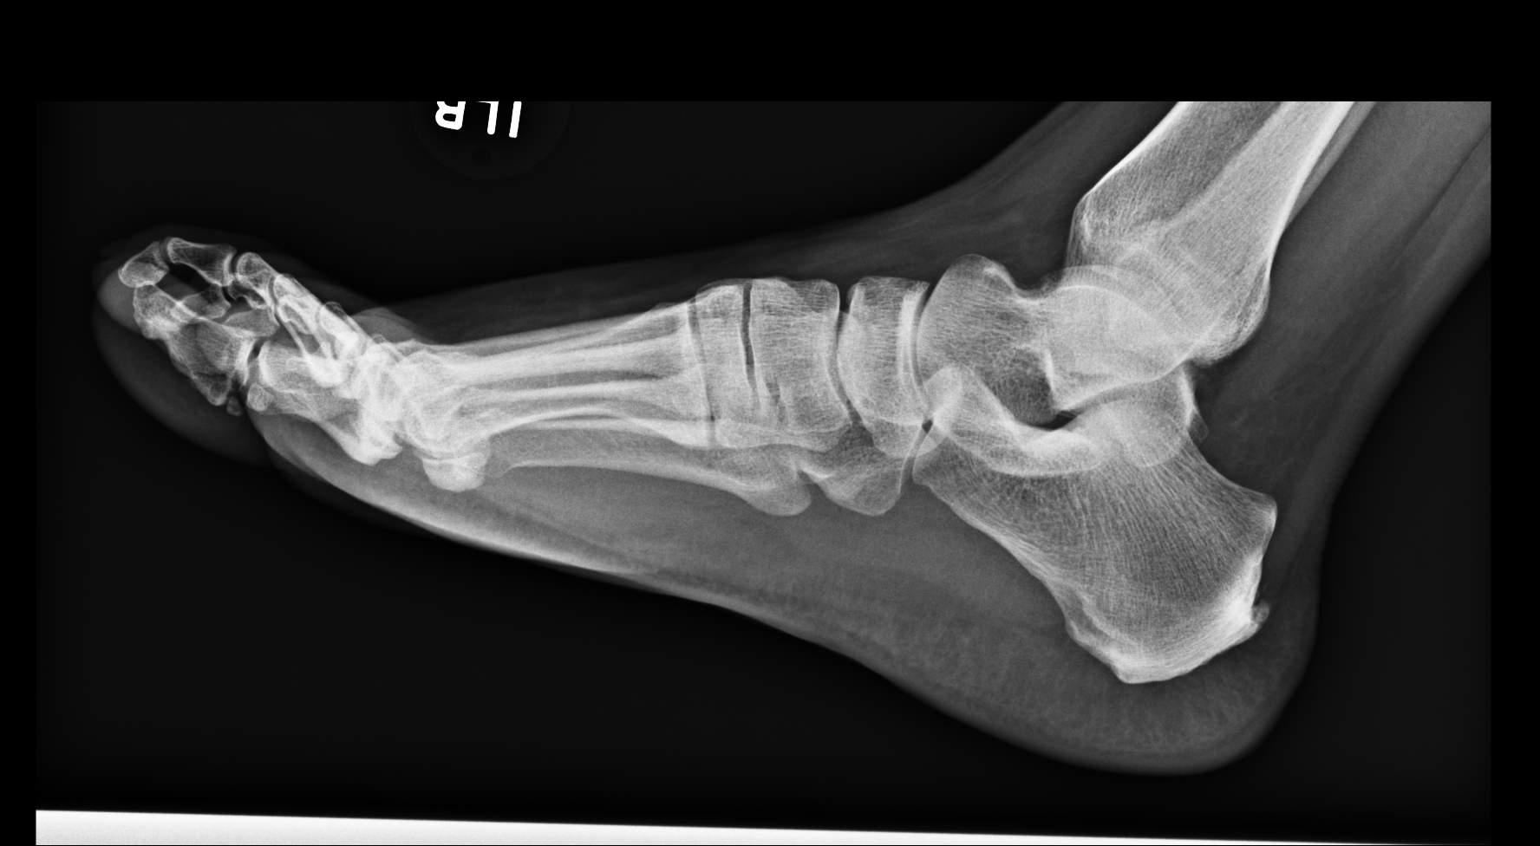
[im 2/3]
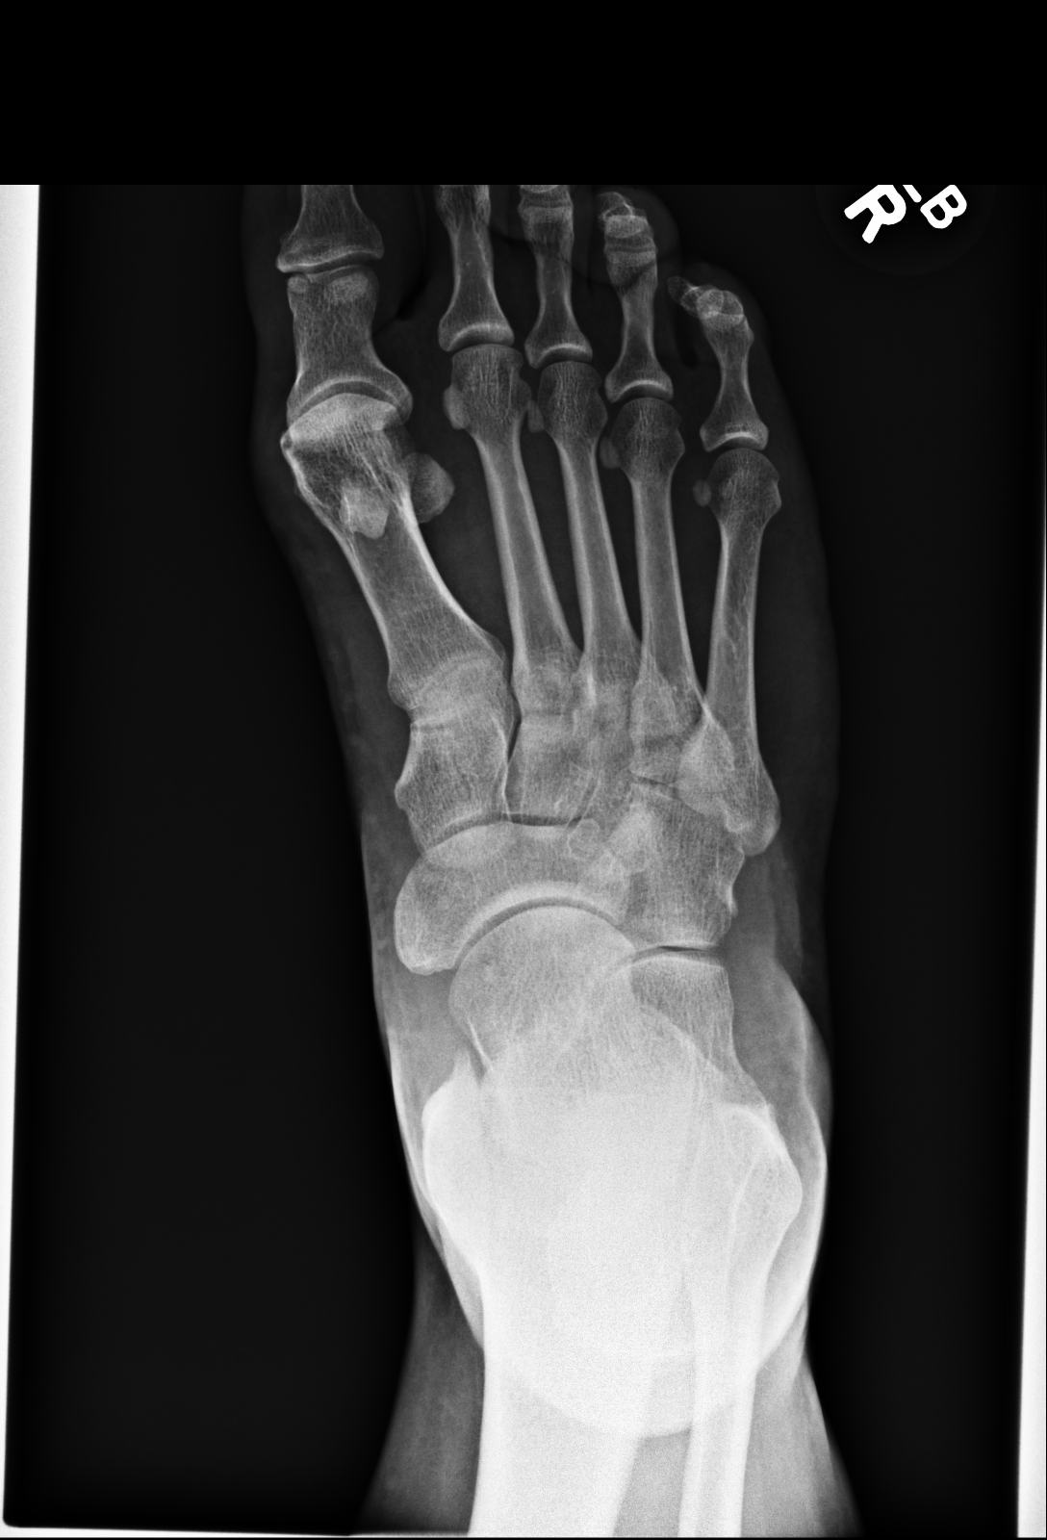
[im 3/3]
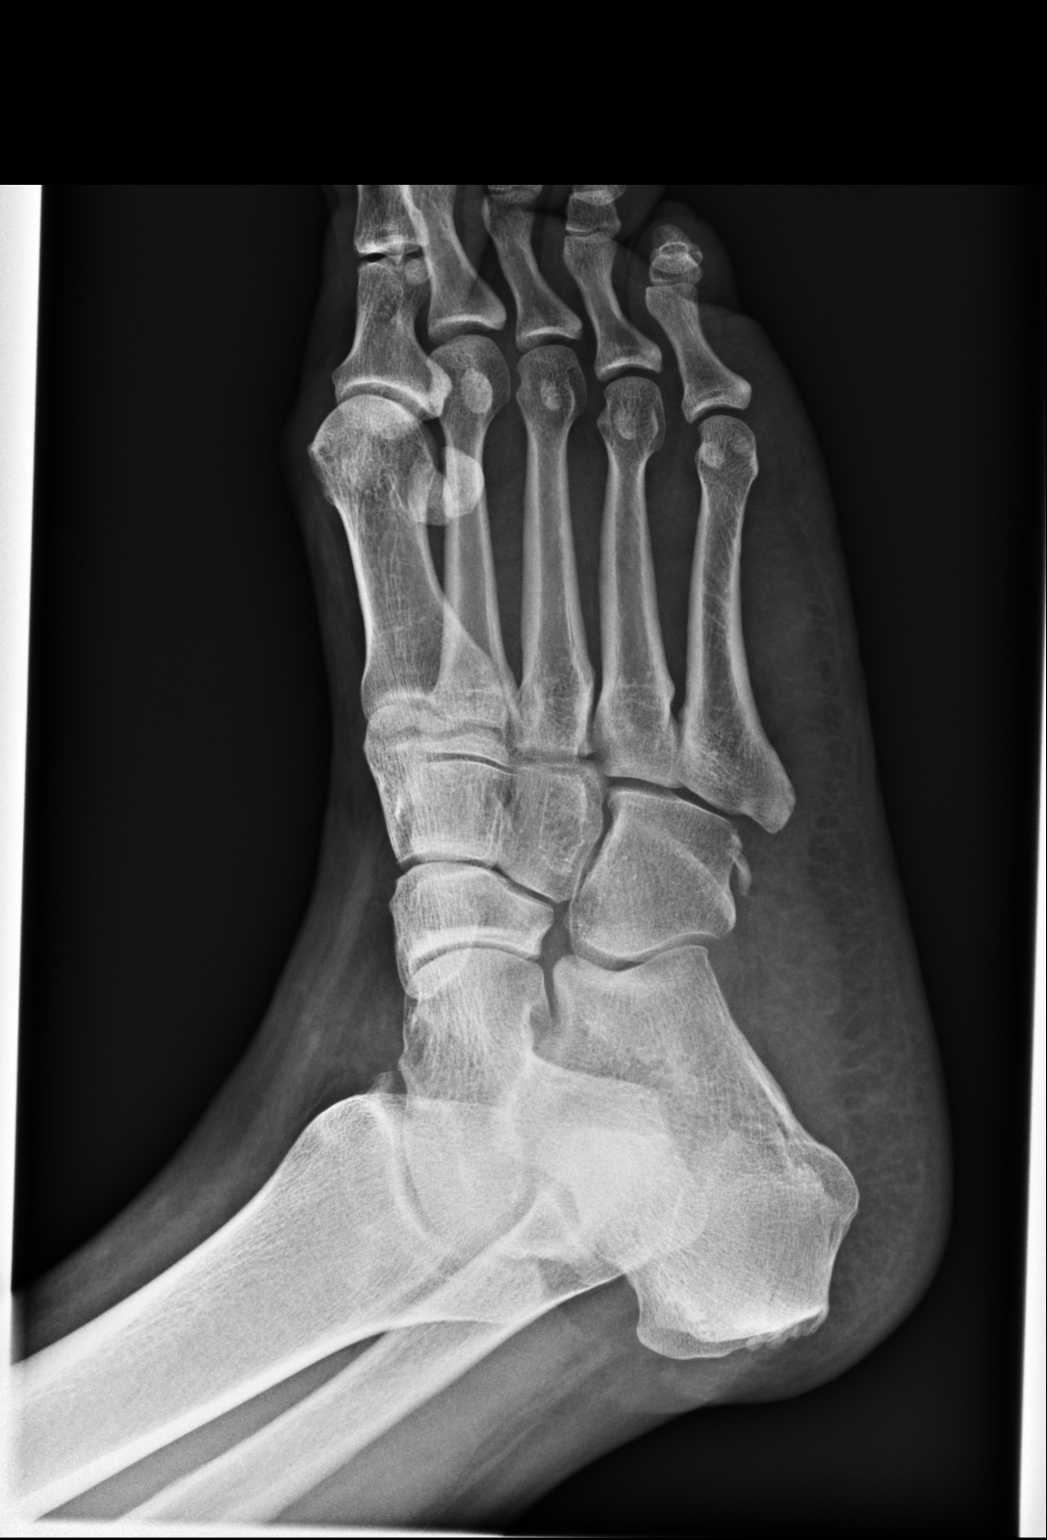

[3 of 3 positions shown; findings below may reference images not displayed]

IMPRESSION: Please see above.

[REDACTED]

## 2013-08-17 ENCOUNTER — Emergency Department: Payer: Self-pay | Admitting: Emergency Medicine

## 2014-02-08 ENCOUNTER — Emergency Department: Payer: Self-pay | Admitting: Emergency Medicine

## 2014-02-08 LAB — URINALYSIS, COMPLETE
BILIRUBIN, UR: NEGATIVE
Glucose,UR: NEGATIVE mg/dL (ref 0–75)
Ketone: NEGATIVE
LEUKOCYTE ESTERASE: NEGATIVE
Nitrite: NEGATIVE
PH: 7 (ref 4.5–8.0)
Protein: 30
RBC,UR: 221 /HPF (ref 0–5)
Specific Gravity: 1.004 (ref 1.003–1.030)

## 2014-02-08 LAB — COMPREHENSIVE METABOLIC PANEL
ALK PHOS: 112 U/L
ANION GAP: 7 (ref 7–16)
Albumin: 3.2 g/dL — ABNORMAL LOW (ref 3.4–5.0)
BUN: 8 mg/dL (ref 7–18)
Bilirubin,Total: 0.5 mg/dL (ref 0.2–1.0)
CHLORIDE: 106 mmol/L (ref 98–107)
CO2: 27 mmol/L (ref 21–32)
Calcium, Total: 8.3 mg/dL — ABNORMAL LOW (ref 8.5–10.1)
Creatinine: 0.81 mg/dL (ref 0.60–1.30)
EGFR (African American): >60
EGFR (Non-African Amer.): >60
GLUCOSE: 93 mg/dL (ref 65–99)
Osmolality: 277 (ref 275–301)
Potassium: 3.3 mmol/L — ABNORMAL LOW (ref 3.5–5.1)
SGOT(AST): 11 U/L — ABNORMAL LOW (ref 15–37)
SGPT (ALT): 21 U/L
Sodium: 140 mmol/L (ref 136–145)
Total Protein: 7.5 g/dL (ref 6.4–8.2)

## 2014-02-08 LAB — CBC WITH DIFFERENTIAL/PLATELET
BASOS PCT: 0.2 %
Basophil #: 0 10*3/uL (ref 0.0–0.1)
Eosinophil #: 0.1 10*3/uL (ref 0.0–0.7)
Eosinophil %: 1.3 %
HCT: 39.5 % (ref 35.0–47.0)
HGB: 13.3 g/dL (ref 12.0–16.0)
LYMPHS ABS: 2.4 10*3/uL (ref 1.0–3.6)
LYMPHS PCT: 20.9 %
MCH: 29.1 pg (ref 26.0–34.0)
MCHC: 33.6 g/dL (ref 32.0–36.0)
MCV: 87 fL (ref 80–100)
MONOS PCT: 8.2 %
Monocyte #: 1 x10 3/mm — ABNORMAL HIGH (ref 0.2–0.9)
Neutrophil #: 8.1 10*3/uL — ABNORMAL HIGH (ref 1.4–6.5)
Neutrophil %: 69.4 %
Platelet: 208 10*3/uL (ref 150–440)
RBC: 4.56 10*6/uL (ref 3.80–5.20)
RDW: 12.6 % (ref 11.5–14.5)
WBC: 11.7 10*3/uL — ABNORMAL HIGH (ref 3.6–11.0)

## 2014-02-09 IMAGING — US US PELV - US TRANSVAGINAL
1 series · 14 of 25 positions shown · non-contrast
Comparison: 03/07/2012

CLINICAL DATA: Pelvic pain.  Menorrhagia.

EXAM:
TRANSABDOMINAL AND TRANSVAGINAL ULTRASOUND OF PELVIS
TECHNIQUE: Both transabdominal and transvaginal ultrasound examinations of the
pelvis were performed. Transabdominal technique was performed for
global imaging of the pelvis including uterus, ovaries, adnexal
regions, and pelvic cul-de-sac. It was necessary to proceed with
endovaginal exam following the transabdominal exam to visualize the
uterus and ovaries.

[Series 1: us pelv - us transvaginal · 0.25mm/px · 14 of 40 slices shown]
[im 1/40]
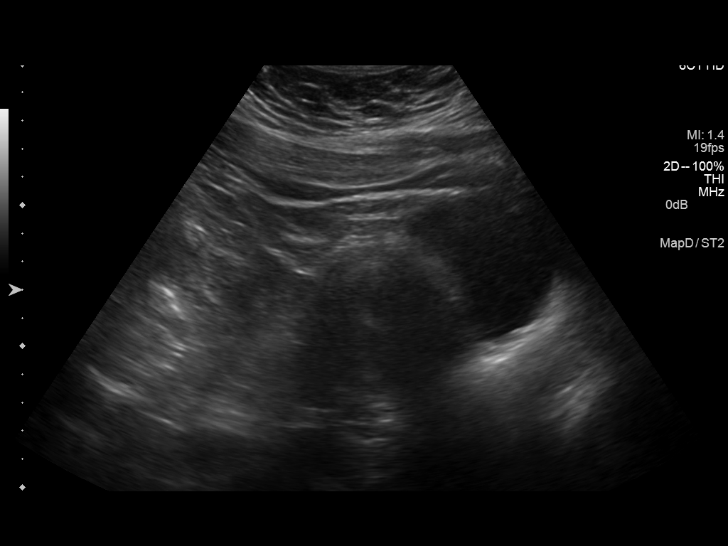
[im 4/40]
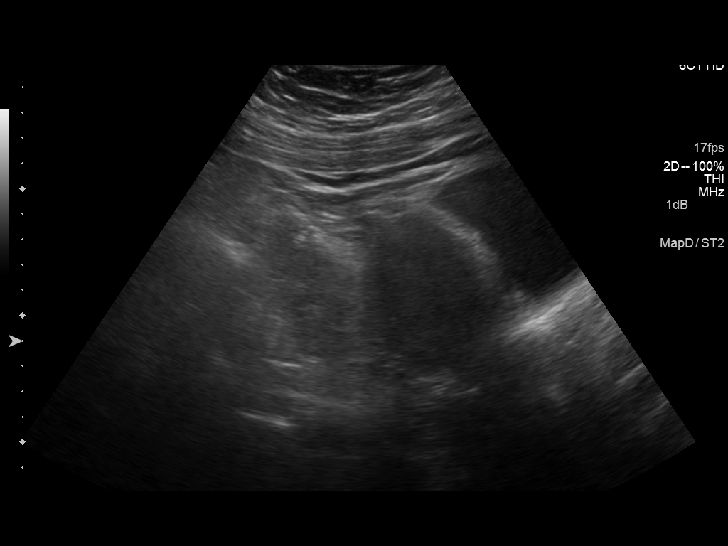
[im 7/40]
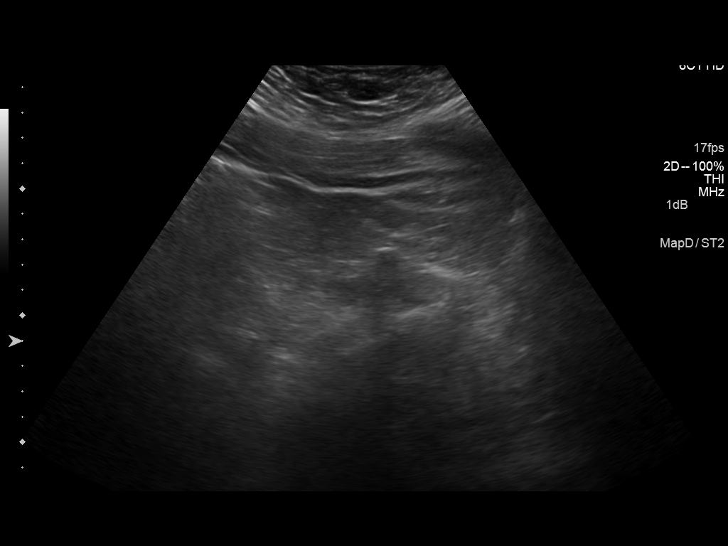
[im 10/40]
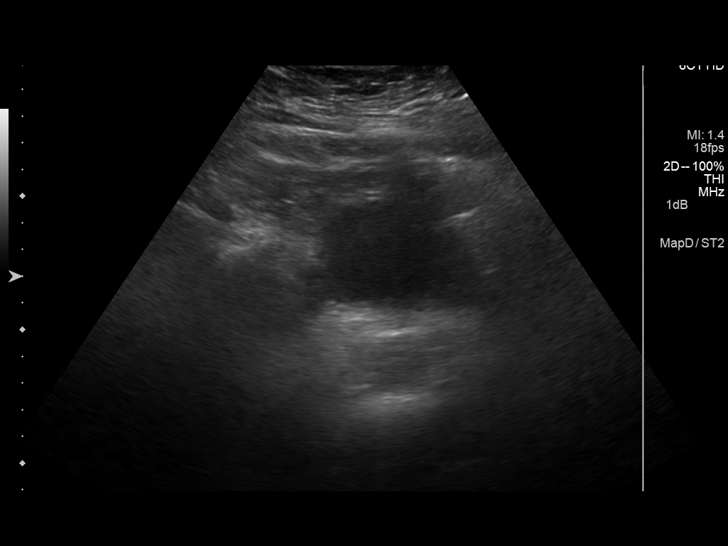
[im 14/40]
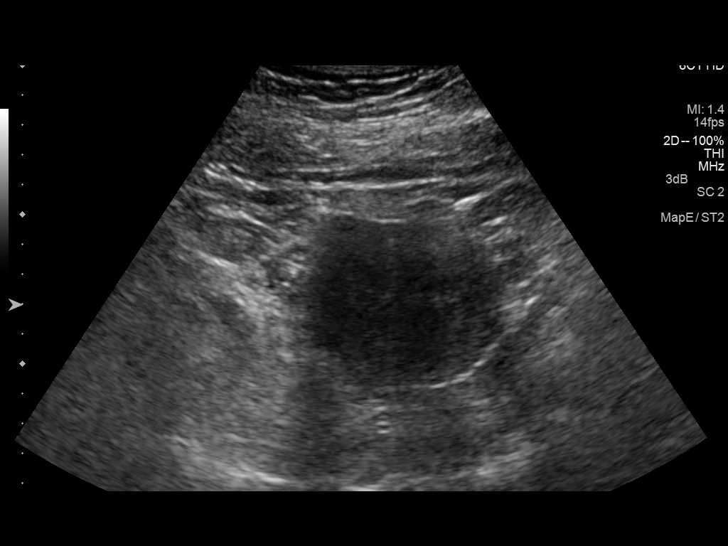
[im 15/40]
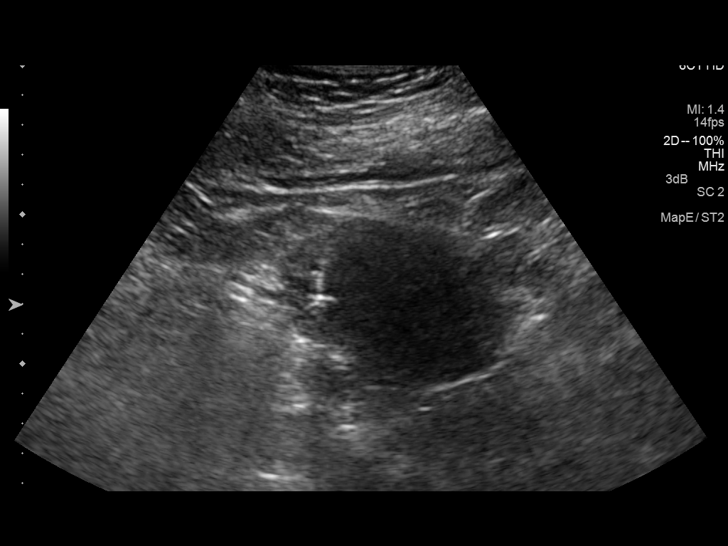
[im 18/40]
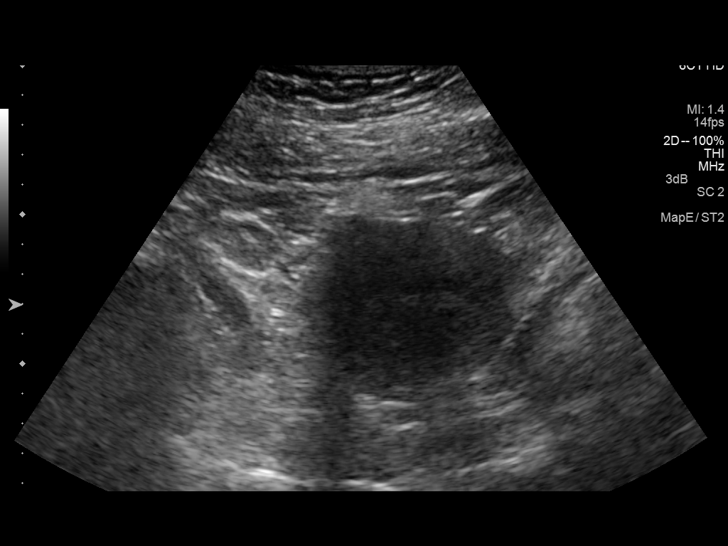
[im 22/40]
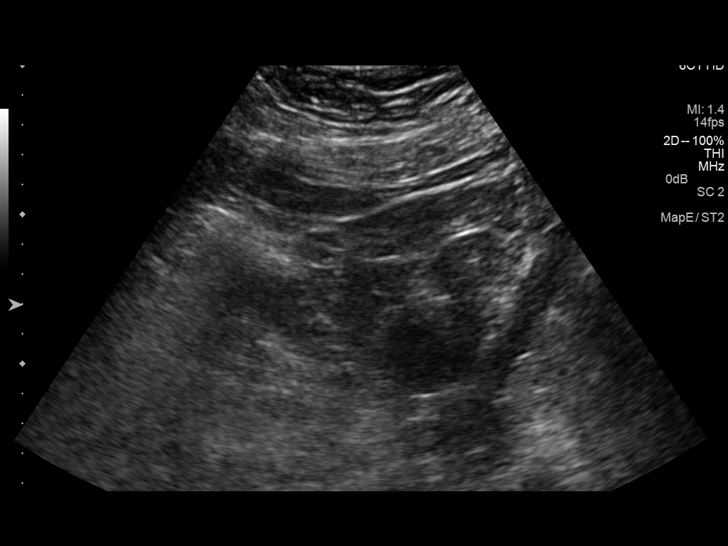
[im 25/40]
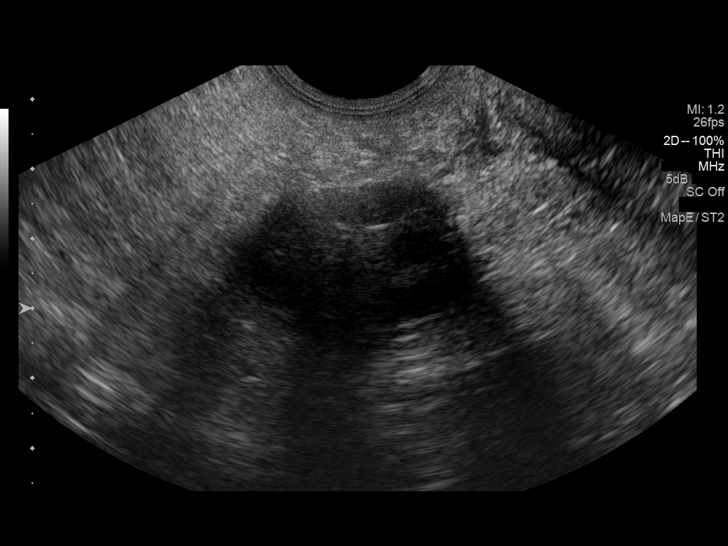
[im 27/40]
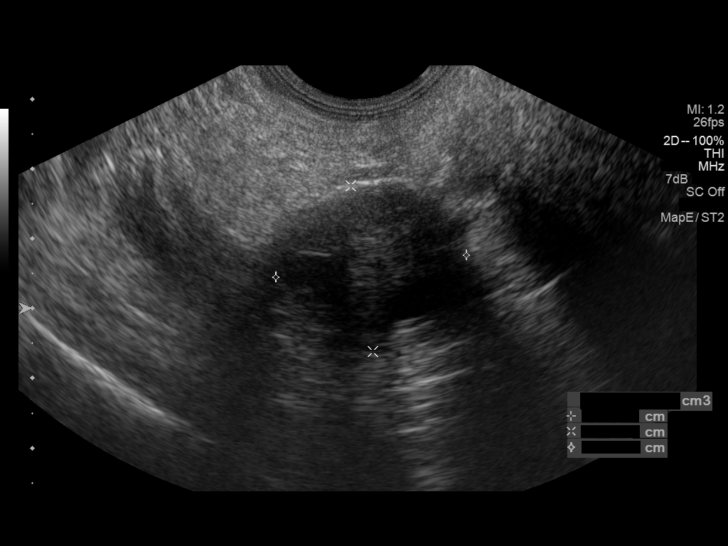
[im 30/40]
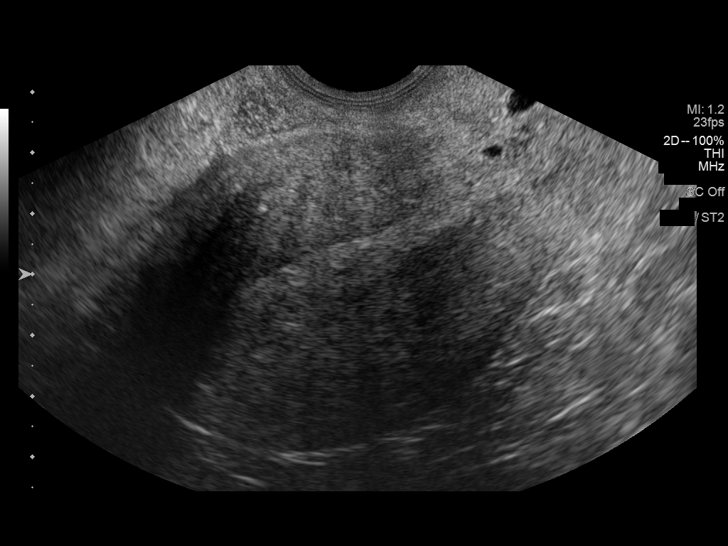
[im 33/40]
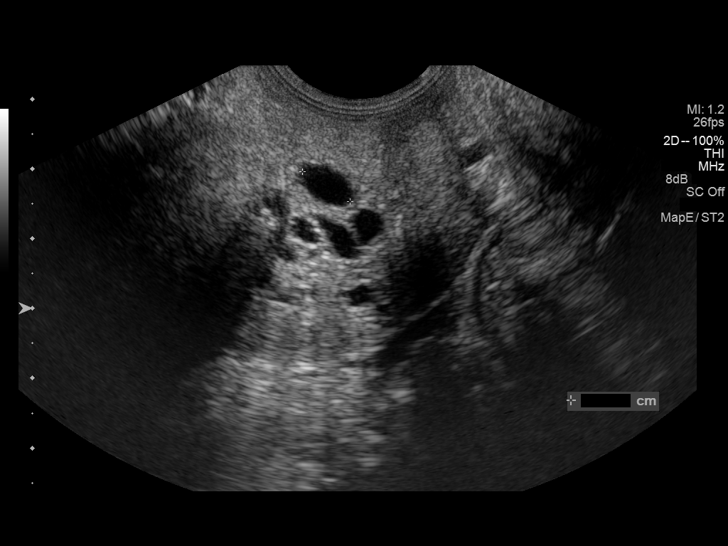
[im 36/40]
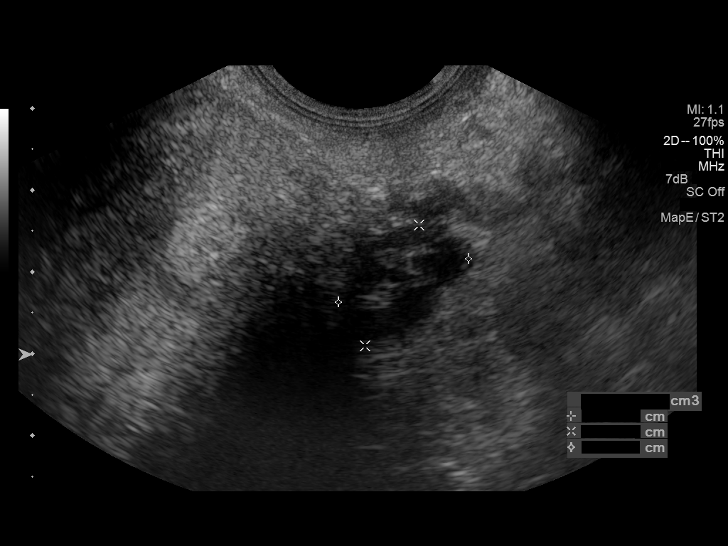
[im 40/40]
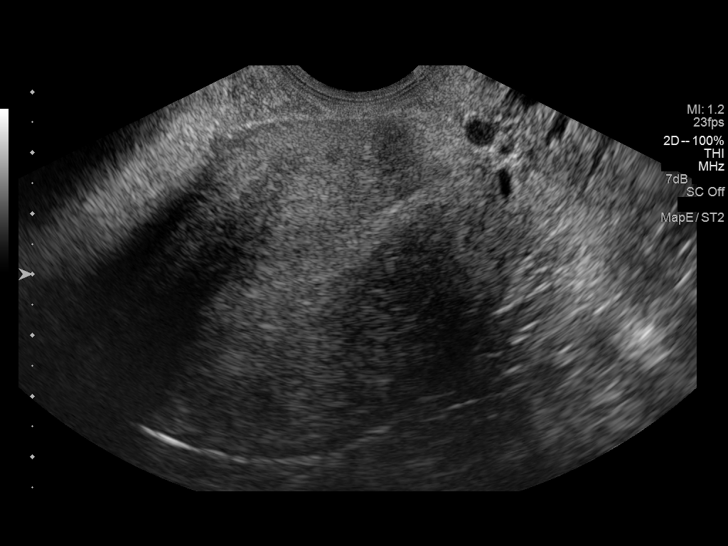

[14 of 25 positions shown; findings below may reference images not displayed]

FINDINGS: Uterus

Measurements: 7.3 x 5.4 x 6.3 cm. No fibroids or other mass
visualized. Small nabothian cysts at the cervix.

Endometrium

Thickness: 6 mm.  No focal abnormality visualized.

Right ovary

Measurements: 3.3 x 2.4 x 2.7 cm. Normal appearance/no adnexal mass.

Left ovary

Measurements: 2.2 x 1.6 x 1.7 cm. Normal appearance/no adnexal mass.

Other findings

No free fluid. Limited color flow Doppler imaging demonstrates flow
to both ovaries.
IMPRESSION: Normal ultrasound appearance of the uterus and ovaries.

## 2015-04-11 ENCOUNTER — Emergency Department
Admission: EM | Admit: 2015-04-11 | Discharge: 2015-04-11 | Disposition: A | Discharge status: Home / Self Care | Payer: Self-pay | Attending: Emergency Medicine | Admitting: Emergency Medicine

## 2015-04-11 ENCOUNTER — Emergency Department: Payer: Self-pay

## 2015-04-11 ENCOUNTER — Encounter: Payer: Self-pay | Admitting: *Deleted

## 2015-04-11 DIAGNOSIS — R0789 Other chest pain: Secondary | ICD-10-CM | POA: Insufficient documentation

## 2015-04-11 DIAGNOSIS — Z79899 Other long term (current) drug therapy: Secondary | ICD-10-CM | POA: Insufficient documentation

## 2015-04-11 DIAGNOSIS — R079 Chest pain, unspecified: Secondary | ICD-10-CM

## 2015-04-11 LAB — BASIC METABOLIC PANEL
ANION GAP: 3 — AB (ref 5–15)
BUN: 12 mg/dL (ref 6–20)
CHLORIDE: 105 mmol/L (ref 101–111)
CO2: 30 mmol/L (ref 22–32)
Calcium: 9.2 mg/dL (ref 8.9–10.3)
Creatinine, Ser: 0.6 mg/dL (ref 0.44–1.00)
GFR calc Af Amer: >60 mL/min (ref >60)
Glucose, Bld: 99 mg/dL (ref 65–99)
POTASSIUM: 3.6 mmol/L (ref 3.5–5.1)
SODIUM: 138 mmol/L (ref 135–145)

## 2015-04-11 LAB — CBC
HEMATOCRIT: 37.5 % (ref 35.0–47.0)
HEMOGLOBIN: 12.4 g/dL (ref 12.0–16.0)
MCH: 26 pg (ref 26.0–34.0)
MCHC: 33.1 g/dL (ref 32.0–36.0)
MCV: 78.3 fL — AB (ref 80.0–100.0)
Platelets: 219 10*3/uL (ref 150–440)
RBC: 4.79 MIL/uL (ref 3.80–5.20)
RDW: 15.3 % — ABNORMAL HIGH (ref 11.5–14.5)
WBC: 7.3 10*3/uL (ref 3.6–11.0)

## 2015-04-11 LAB — TROPONIN I: Troponin I: <0.03 ng/mL (ref <0.031)

## 2015-04-11 IMAGING — CR DG CHEST 2V
2 series · 2 of 2 positions shown · non-contrast
Comparison: 10/26/2011

CLINICAL DATA: Chest pain

EXAM:
CHEST  2 VIEW

[chest pa]
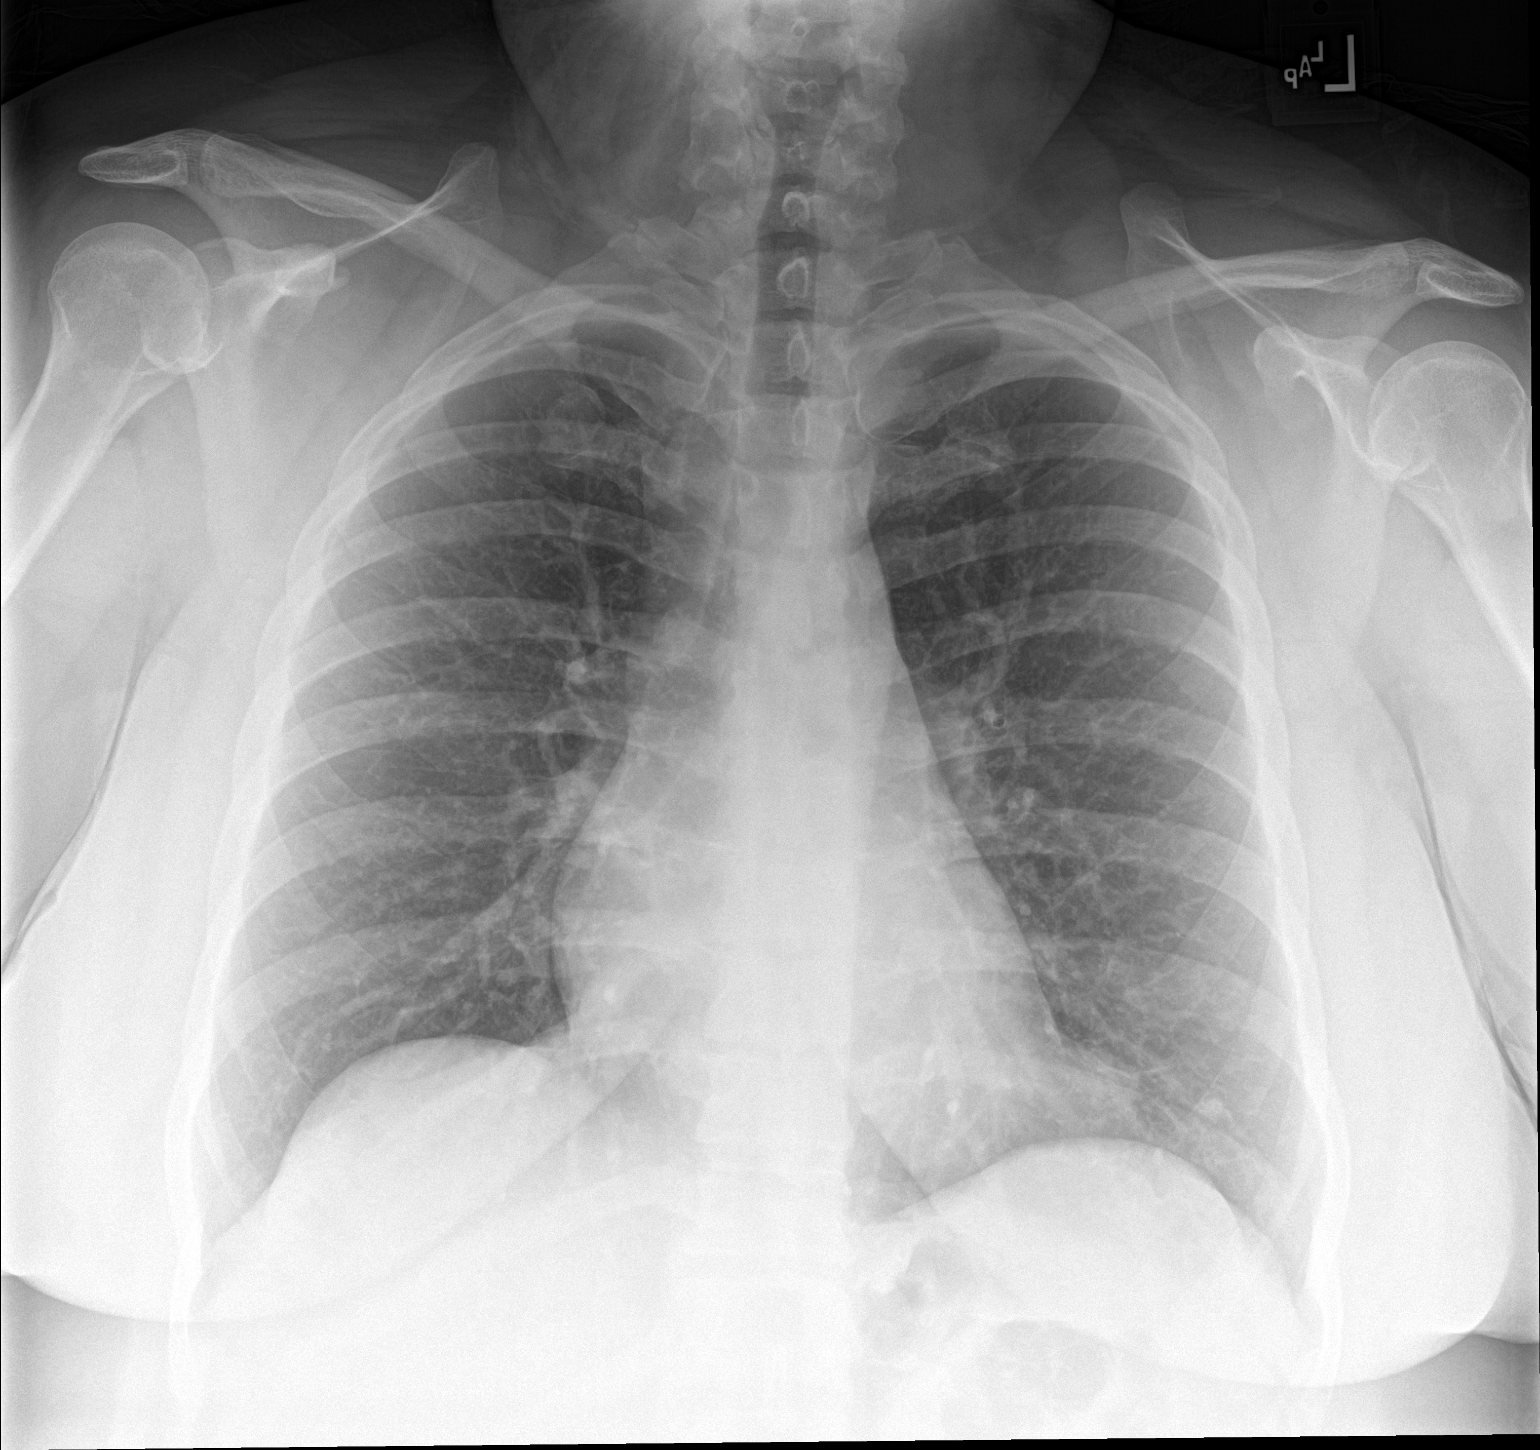

[chest lat]
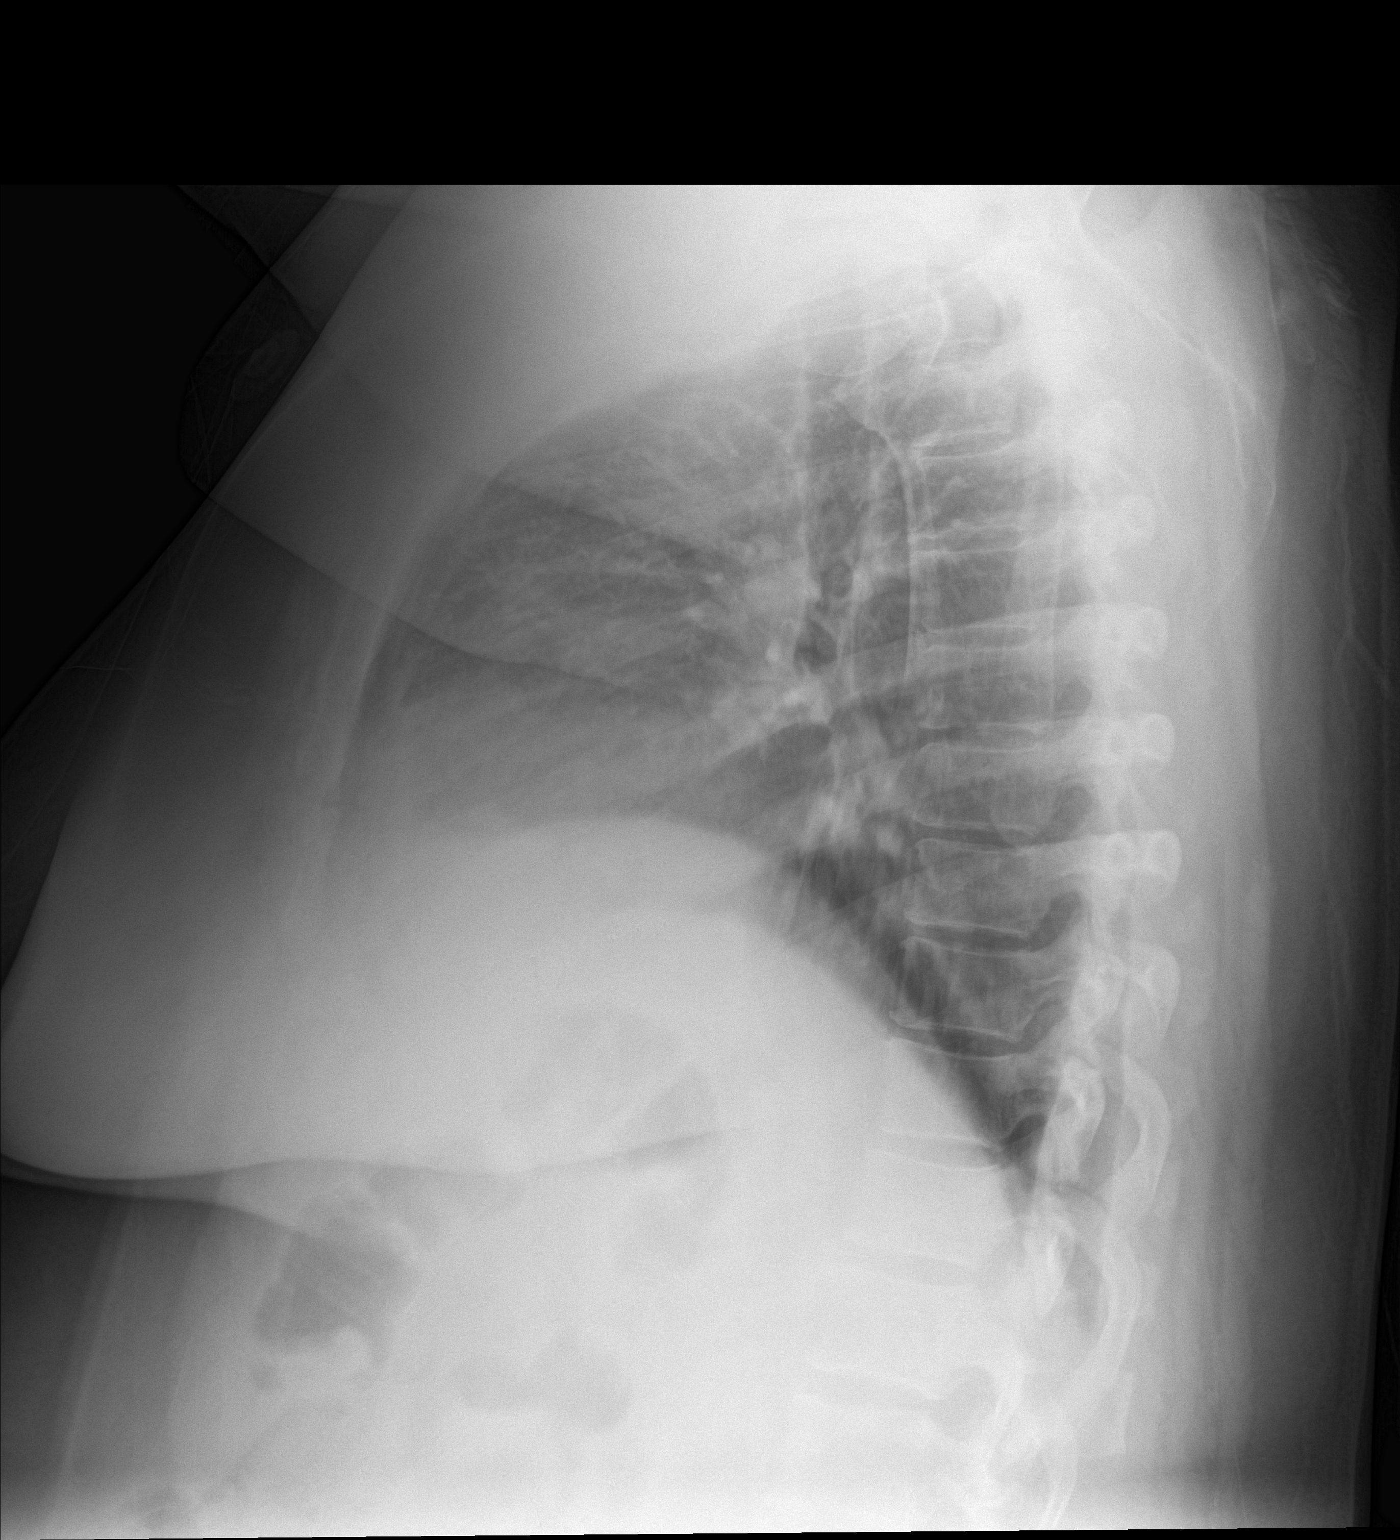

[2 of 2 positions shown; findings below may reference images not displayed]

FINDINGS: Normal heart size and mediastinal contours. Dense pulmonary nodule
at the left base is stable from 2654 and likely calcified granuloma.
There is no edema, consolidation, effusion, or pneumothorax. No
osseous findings to explain chest pain.
IMPRESSION: No active cardiopulmonary disease.

## 2015-04-11 MED ORDER — HYDROCODONE-ACETAMINOPHEN 5-325 MG PO TABS: 1.0000 | ORAL_TABLET | Freq: Four times a day (QID) | ORAL | Status: DC | PRN

## 2015-04-11 MED ORDER — IBUPROFEN 800 MG PO TABS: 800.0000 mg | ORAL_TABLET | Freq: Three times a day (TID) | ORAL | Status: DC | PRN

## 2015-04-11 MED ORDER — KETOROLAC TROMETHAMINE 30 MG/ML IJ SOLN
60.0000 mg | Freq: Once | INTRAMUSCULAR | Status: AC
  Administered 2015-04-11: 60 mg via INTRAMUSCULAR
  Filled 2015-04-11: qty 2

## 2015-04-11 NOTE — Discharge Instructions (Signed)
1. You may take pain medicines as needed (Motrin/Norco #15). 2. Apply moist heat to affected area several times daily. 3. Return to the ER for worsening symptoms, persistent vomiting, difficulty breathing or other concerns.  Chest Wall Pain Chest wall pain is pain in or around the bones and muscles of your chest. Sometimes, an injury causes this pain. Sometimes, the cause may not be known. This pain may take several weeks or longer to get better. HOME CARE INSTRUCTIONS  Pay attention to any changes in your symptoms. Take these actions to help with your pain:   Rest as told by your health care provider.   Avoid activities that cause pain. These include any activities that use your chest muscles or your abdominal and side muscles to lift heavy items.   If directed, apply ice to the painful area:  Put ice in a plastic bag.  Place a towel between your skin and the bag.  Leave the ice on for 20 minutes, 2-3 times per day.  Take over-the-counter and prescription medicines only as told by your health care provider.  Do not use tobacco products, including cigarettes, chewing tobacco, and e-cigarettes. If you need help quitting, ask your health care provider.  Keep all follow-up visits as told by your health care provider. This is important. SEEK MEDICAL CARE IF:  You have a fever.  Your chest pain becomes worse.  You have new symptoms. SEEK IMMEDIATE MEDICAL CARE IF:  You have nausea or vomiting.  You feel sweaty or light-headed.  You have a cough with phlegm (sputum) or you cough up blood.  You develop shortness of breath.   This information is not intended to replace advice given to you by your health care provider. Make sure you discuss any questions you have with your health care provider.   Document Released: 05/18/2005 Document Revised: 02/06/2015 Document Reviewed: 08/13/2014 Elsevier Interactive Patient Education 2016 [employer]  Nonspecific Chest Pain  Chest  pain can be caused by many different conditions. There is always a chance that your pain could be related to something serious, such as a heart attack or a blood clot in your lungs. Chest pain can also be caused by conditions that are not life-threatening. If you have chest pain, it is very important to follow up with your health care provider. CAUSES  Chest pain can be caused by:  Heartburn.  Pneumonia or bronchitis.  Anxiety or stress.  Inflammation around your heart (pericarditis) or lung (pleuritis or pleurisy).  A blood clot in your lung.  A collapsed lung (pneumothorax). It can develop suddenly on its own (spontaneous pneumothorax) or from trauma to the chest.  Shingles infection (varicella-zoster virus).  Heart attack.  Damage to the bones, muscles, and cartilage that make up your chest wall. This can include:  Bruised bones due to injury.  Strained muscles or cartilage due to frequent or repeated coughing or overwork.  Fracture to one or more ribs.  Sore cartilage due to inflammation (costochondritis). RISK FACTORS  Risk factors for chest pain may include:  Activities that increase your risk for trauma or injury to your chest.  Respiratory infections or conditions that cause frequent coughing.  Medical conditions or overeating that can cause heartburn.  Heart disease or family history of heart disease.  Conditions or health behaviors that increase your risk of developing a blood clot.  Having had chicken pox (varicella zoster). SIGNS AND SYMPTOMS Chest pain can feel like:  Burning or tingling on the surface of  your chest or deep in your chest.  Crushing, pressure, aching, or squeezing pain.  Dull or sharp pain that is worse when you move, cough, or take a deep breath.  Pain that is also felt in your back, neck, shoulder, or arm, or pain that spreads to any of these areas. Your chest pain may come and go, or it may stay constant. DIAGNOSIS Lab tests or  other studies may be needed to find the cause of your pain. Your health care provider may have you take a test called an ambulatory ECG (electrocardiogram). An ECG records your heartbeat patterns at the time the test is performed. You may also have other tests, such as:  Transthoracic echocardiogram (TTE). During echocardiography, sound waves are used to create a picture of all of the heart structures and to look at how blood flows through your heart.  Transesophageal echocardiogram (TEE).This is a more advanced imaging test that obtains images from inside your body. It allows your health care provider to see your heart in finer detail.  Cardiac monitoring. This allows your health care provider to monitor your heart rate and rhythm in real time.  Holter monitor. This is a portable device that records your heartbeat and can help to diagnose abnormal heartbeats. It allows your health care provider to track your heart activity for several days, if needed.  Stress tests. These can be done through exercise or by taking medicine that makes your heart beat more quickly.  Blood tests.  Imaging tests. TREATMENT  Your treatment depends on what is causing your chest pain. Treatment may include:  Medicines. These may include:  Acid blockers for heartburn.  Anti-inflammatory medicine.  Pain medicine for inflammatory conditions.  Antibiotic medicine, if an infection is present.  Medicines to dissolve blood clots.  Medicines to treat coronary artery disease.  Supportive care for conditions that do not require medicines. This may include:  Resting.  Applying heat or cold packs to injured areas.  Limiting activities until pain decreases. HOME CARE INSTRUCTIONS  If you were prescribed an antibiotic medicine, finish it all even if you start to feel better.  Avoid any activities that bring on chest pain.  Do not use any tobacco products, including cigarettes, chewing tobacco, or electronic  cigarettes. If you need help quitting, ask your health care provider.  Do not drink alcohol.  Take medicines only as directed by your health care provider.  Keep all follow-up visits as directed by your health care provider. This is important. This includes any further testing if your chest pain does not go away.  If heartburn is the cause for your chest pain, you may be told to keep your head raised (elevated) while sleeping. This reduces the chance that acid will go from your stomach into your esophagus.  Make lifestyle changes as directed by your health care provider. These may include:  Getting regular exercise. Ask your health care provider to suggest some activities that are safe for you.  Eating a heart-healthy diet. A registered dietitian can help you to learn healthy eating options.  Maintaining a healthy weight.  Managing diabetes, if necessary.  Reducing stress. SEEK MEDICAL CARE IF:  Your chest pain does not go away after treatment.  You have a rash with blisters on your chest.  You have a fever. SEEK IMMEDIATE MEDICAL CARE IF:   Your chest pain is worse.  You have an increasing cough, or you cough up blood.  You have severe abdominal pain.  You have  severe weakness.  You faint.  You have chills.  You have sudden, unexplained chest discomfort.  You have sudden, unexplained discomfort in your arms, back, neck, or jaw.  You have shortness of breath at any time.  You suddenly start to sweat, or your skin gets clammy.  You feel nauseous or you vomit.  You suddenly feel light-headed or dizzy.  Your heart begins to beat quickly, or it feels like it is skipping beats. These symptoms may represent a serious problem that is an emergency. Do not wait to see if the symptoms will go away. Get medical help right away. Call your local emergency services (911 in the [place] Do not drive yourself to the hospital.   This information is not intended to replace advice  given to you by your health care provider. Make sure you discuss any questions you have with your health care provider.   Document Released: 02/25/2005 Document Revised: 06/08/2014 Document Reviewed: 12/22/2013 Elsevier Interactive Patient Education [employer].

## 2015-04-11 NOTE — ED Notes (Signed)
Pt was awakened with chest pain and sob tonight. Pain radiates into left arm.  No n/v/d.  No diaphoresis.  Non smoker

## 2015-04-11 NOTE — ED Provider Notes (Signed)
[facility] Emergency Department Provider Note  ____________________________________________  Time seen: Approximately 5:35 AM  I have reviewed the triage vital signs and the nursing notes.   HISTORY  Chief Complaint Chest Pain    HPI Rhonda Barrett is a 39 y.o. female who presents to the ED with a chief complaint of chest pain. Patient reports a two-week history of constant, 24/7 (24 hours per day, 7 days per week) central and left-sided chest pressure radiating into her left arm. Symptoms occasionally associated with shortness of breath. Complains of nonproductive cough. Denies diaphoresis, nausea, vomiting, dizziness, palpitations. Denies fever, chills, abdominal pain.Denies recent travel, trauma or hormone use. Nothing makes her pain better. Movement makes her pain worse. As an example, patient tells me it hurts her chest to wipe after urination because of the movement. She presents to the ED tonight for evaluation because she is tired of the pain.   Past medical history None  There are no active problems to display for this patient.   No past surgical history on file.  Current Outpatient Rx  Name  Route  Sig  Dispense  Refill  . citalopram (CELEXA) 20 MG tablet   Oral   Take 20 mg by mouth daily.         Marland Kitchen. ibuprofen (ADVIL,MOTRIN) 200 MG tablet   Oral   Take 200-400 mg by mouth every 6 (six) hours as needed.           Allergies Review of patient's allergies indicates no known allergies.  Family history None   Social History Social History  Substance Use Topics  . Smoking status: Never Smoker   . Smokeless tobacco: None  . Alcohol Use: No    Review of Systems Constitutional: No fever/chills Eyes: No visual changes. ENT: No sore throat. Cardiovascular: Positive for chest pain. Respiratory: Denies shortness of breath. Gastrointestinal: No abdominal pain.  No nausea, no vomiting.  No diarrhea.  No constipation. Genitourinary:  Negative for dysuria. Musculoskeletal: Negative for back pain. Skin: Negative for rash. Neurological: Negative for headaches, focal weakness or numbness.  10-point ROS otherwise negative.  ____________________________________________   PHYSICAL EXAM:  VITAL SIGNS: ED Triage Vitals  Enc Vitals Group     BP 04/11/15 0243 144/76 mmHg     Pulse Rate 04/11/15 0243 8     Resp 04/11/15 0243 20     Temp 04/11/15 0243 98.2 F (36.8 C)     Temp Source 04/11/15 0243 Oral     SpO2 04/11/15 0243 96 %     Weight 04/11/15 0243 270 lb (122.471 kg)     Height 04/11/15 0243 5\' 6"  (1.676 m)     Head Cir --      Peak Flow --      Pain Score 04/11/15 0240 8     Pain Loc --      Pain Edu? --      Excl. in GC? --     Constitutional: Asleep, easilyawakened for exam. Alert and oriented. Well appearing and in no acute distress. Eyes: Conjunctivae are normal. PERRL. EOMI. Head: Atraumatic. Nose: No congestion/rhinnorhea. Mouth/Throat: Mucous membranes are moist.  Oropharynx non-erythematous. Neck: No stridor.   Cardiovascular: Normal rate, regular rhythm. Grossly normal heart sounds.  Good peripheral circulation. Respiratory: Normal respiratory effort.  No retractions. Lungs CTAB. Anterior chest wall tender to palpation. Chest tender on movement and raising arms. Gastrointestinal: Soft and nontender. No distention. No abdominal bruits. No CVA tenderness. Musculoskeletal: No lower extremity tenderness  nor edema.  No joint effusions. Neurologic:  Normal speech and language. No gross focal neurologic deficits are appreciated. No gait instability. Skin:  Skin is warm, dry and intact. No rash noted. Psychiatric: Mood and affect are normal. Speech and behavior are normal.  ____________________________________________   LABS (all labs ordered are listed, but only abnormal results are displayed)  Labs Reviewed  BASIC METABOLIC PANEL - Abnormal; Notable for the following:    Anion gap 3 (*)    All  other components within normal limits  CBC - Abnormal; Notable for the following:    MCV 78.3 (*)    RDW 15.3 (*)    All other components within normal limits  TROPONIN I   ____________________________________________  EKG  ED ECG REPORT I, Sylva Overley J, the attending physician, personally viewed and interpreted this ECG.   Date: 04/11/2015  EKG Time: 0241  Rate: 81  Rhythm: normal EKG, normal sinus rhythm  Axis: normal  Intervals:none  ST&T Change: nonspecific  ____________________________________________  RADIOLOGY  Chest 2 view (viewed by me, interpreted per Dr. Grace Isaac): No active cardiopulmonary disease. ____________________________________________   PROCEDURES  Procedure(s) performed: None  Critical Care performed: No  ____________________________________________   INITIAL IMPRESSION / ASSESSMENT AND PLAN / ED COURSE  Pertinent labs & imaging results that were available during my care of the patient were reviewed by me and considered in my medical decision making (see chart for details).  39 year old female who presents with a two-week history of constant, 24/7 chest pain which is worsened by movement. I would consider patient to be low risk for ACS, PE, aortic dissection. EKG and labs unremarkable; negative troponin. Do not feel utility of repeating troponin as patient has been experiencing constant chest pain for 2 weeks. We will, however, obtain chest x-ray given patient's cough and reproducible chest wall pain. IM Toradol ordered for relief of symptoms. Will reassess.  ----------------------------------------- 6:59 AM on 04/11/2015 -----------------------------------------  Patient improved. Updated patient of imaging results. Strict return precautions given. Patient verbalizes understanding and agrees with plan of care. ____________________________________________   FINAL CLINICAL IMPRESSION(S) / ED DIAGNOSES  Final diagnoses:  Chest pain, unspecified  chest pain type  Chest wall pain      Irean Hong, MD 04/11/15 [phone]

## 2015-09-23 DIAGNOSIS — N2889 Other specified disorders of kidney and ureter: Secondary | ICD-10-CM | POA: Insufficient documentation

## 2015-10-05 ENCOUNTER — Emergency Department
Admission: EM | Admit: 2015-10-05 | Discharge: 2015-10-05 | Disposition: A | Discharge status: Home / Self Care | Payer: Self-pay | Attending: Emergency Medicine | Admitting: Emergency Medicine

## 2015-10-05 ENCOUNTER — Encounter: Payer: Self-pay | Admitting: Emergency Medicine

## 2015-10-05 ENCOUNTER — Emergency Department: Payer: Self-pay

## 2015-10-05 DIAGNOSIS — J209 Acute bronchitis, unspecified: Secondary | ICD-10-CM | POA: Insufficient documentation

## 2015-10-05 IMAGING — CR DG CHEST 2V
1 series · 2 of 2 positions shown · non-contrast
Comparison: 04/11/2015

CLINICAL DATA: Cough, headache, and fever starting [REDACTED].

EXAM:
CHEST  2 VIEW

[Series 1: dg chest 2 view · 0.14mm/px · 2 of 2 slices shown]
[im 1/2]
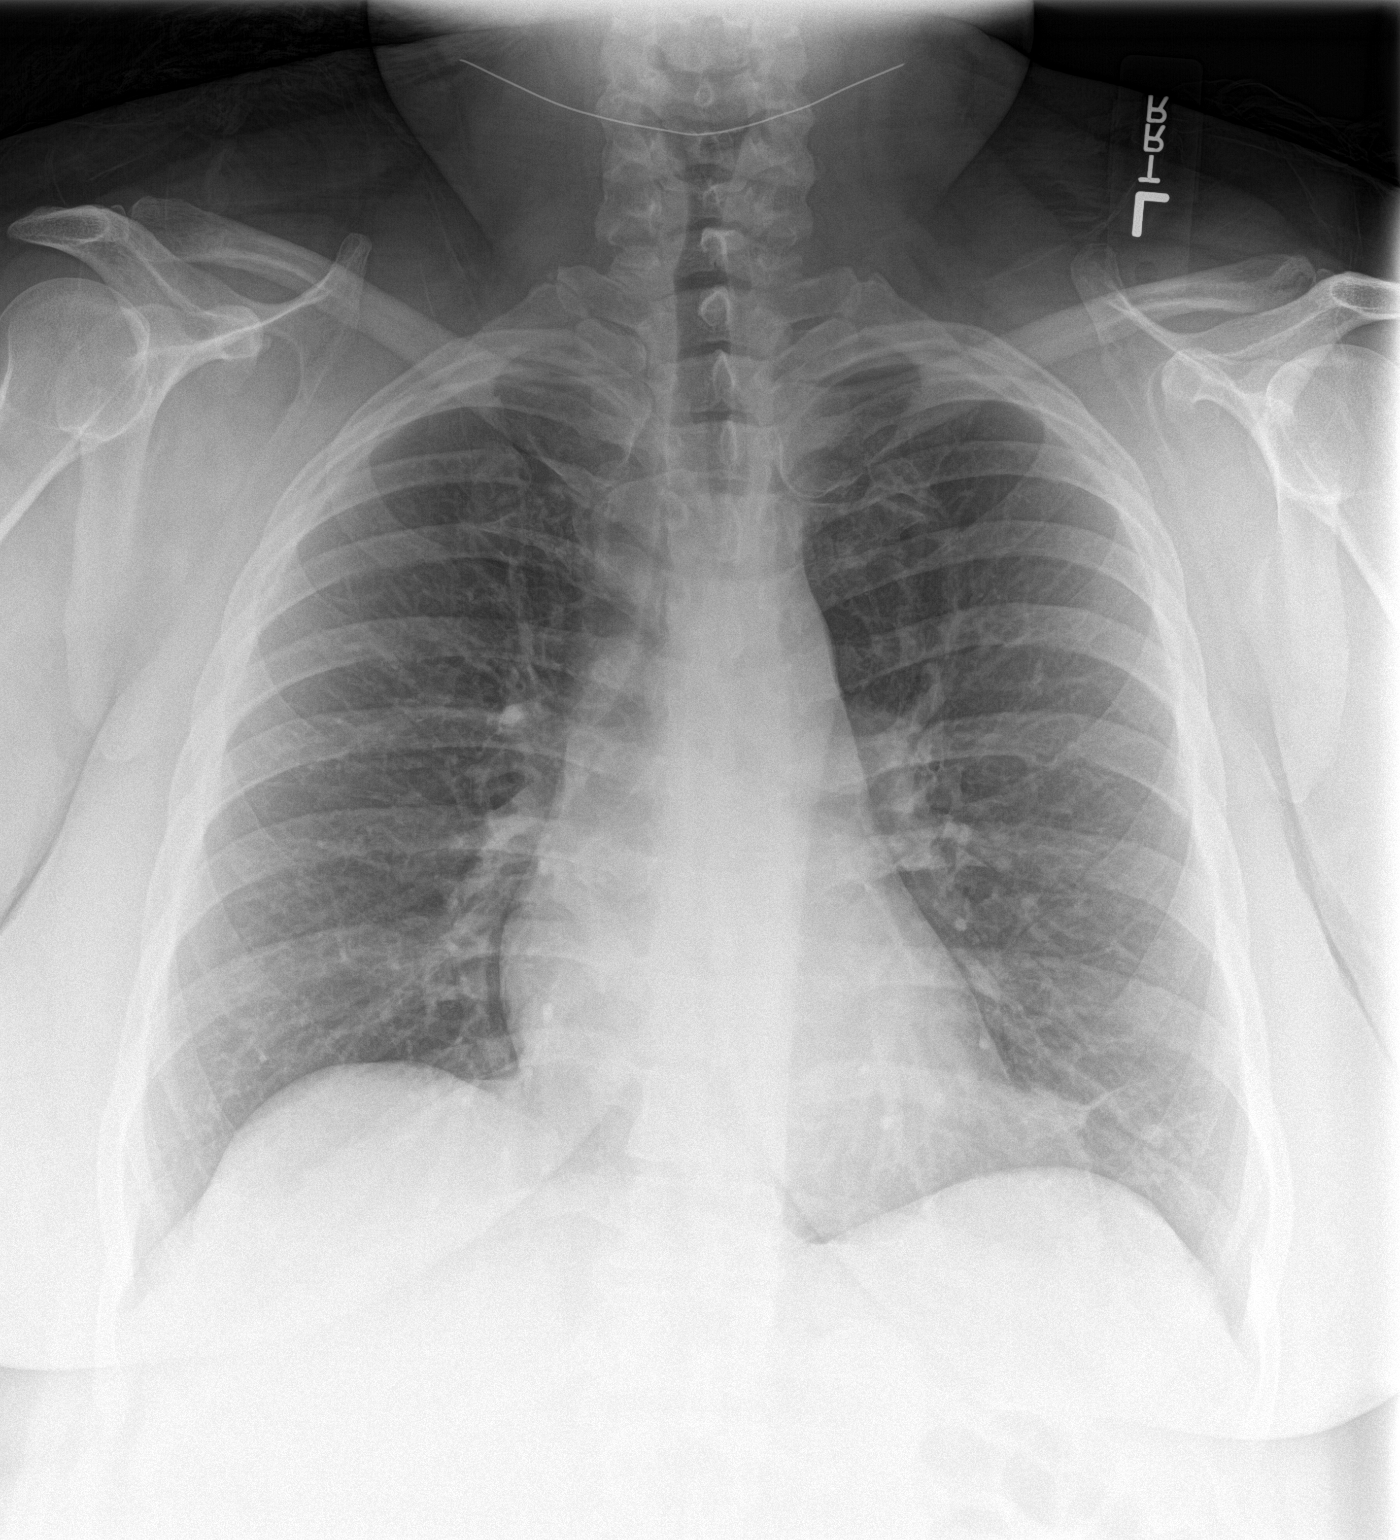
[im 2/2]
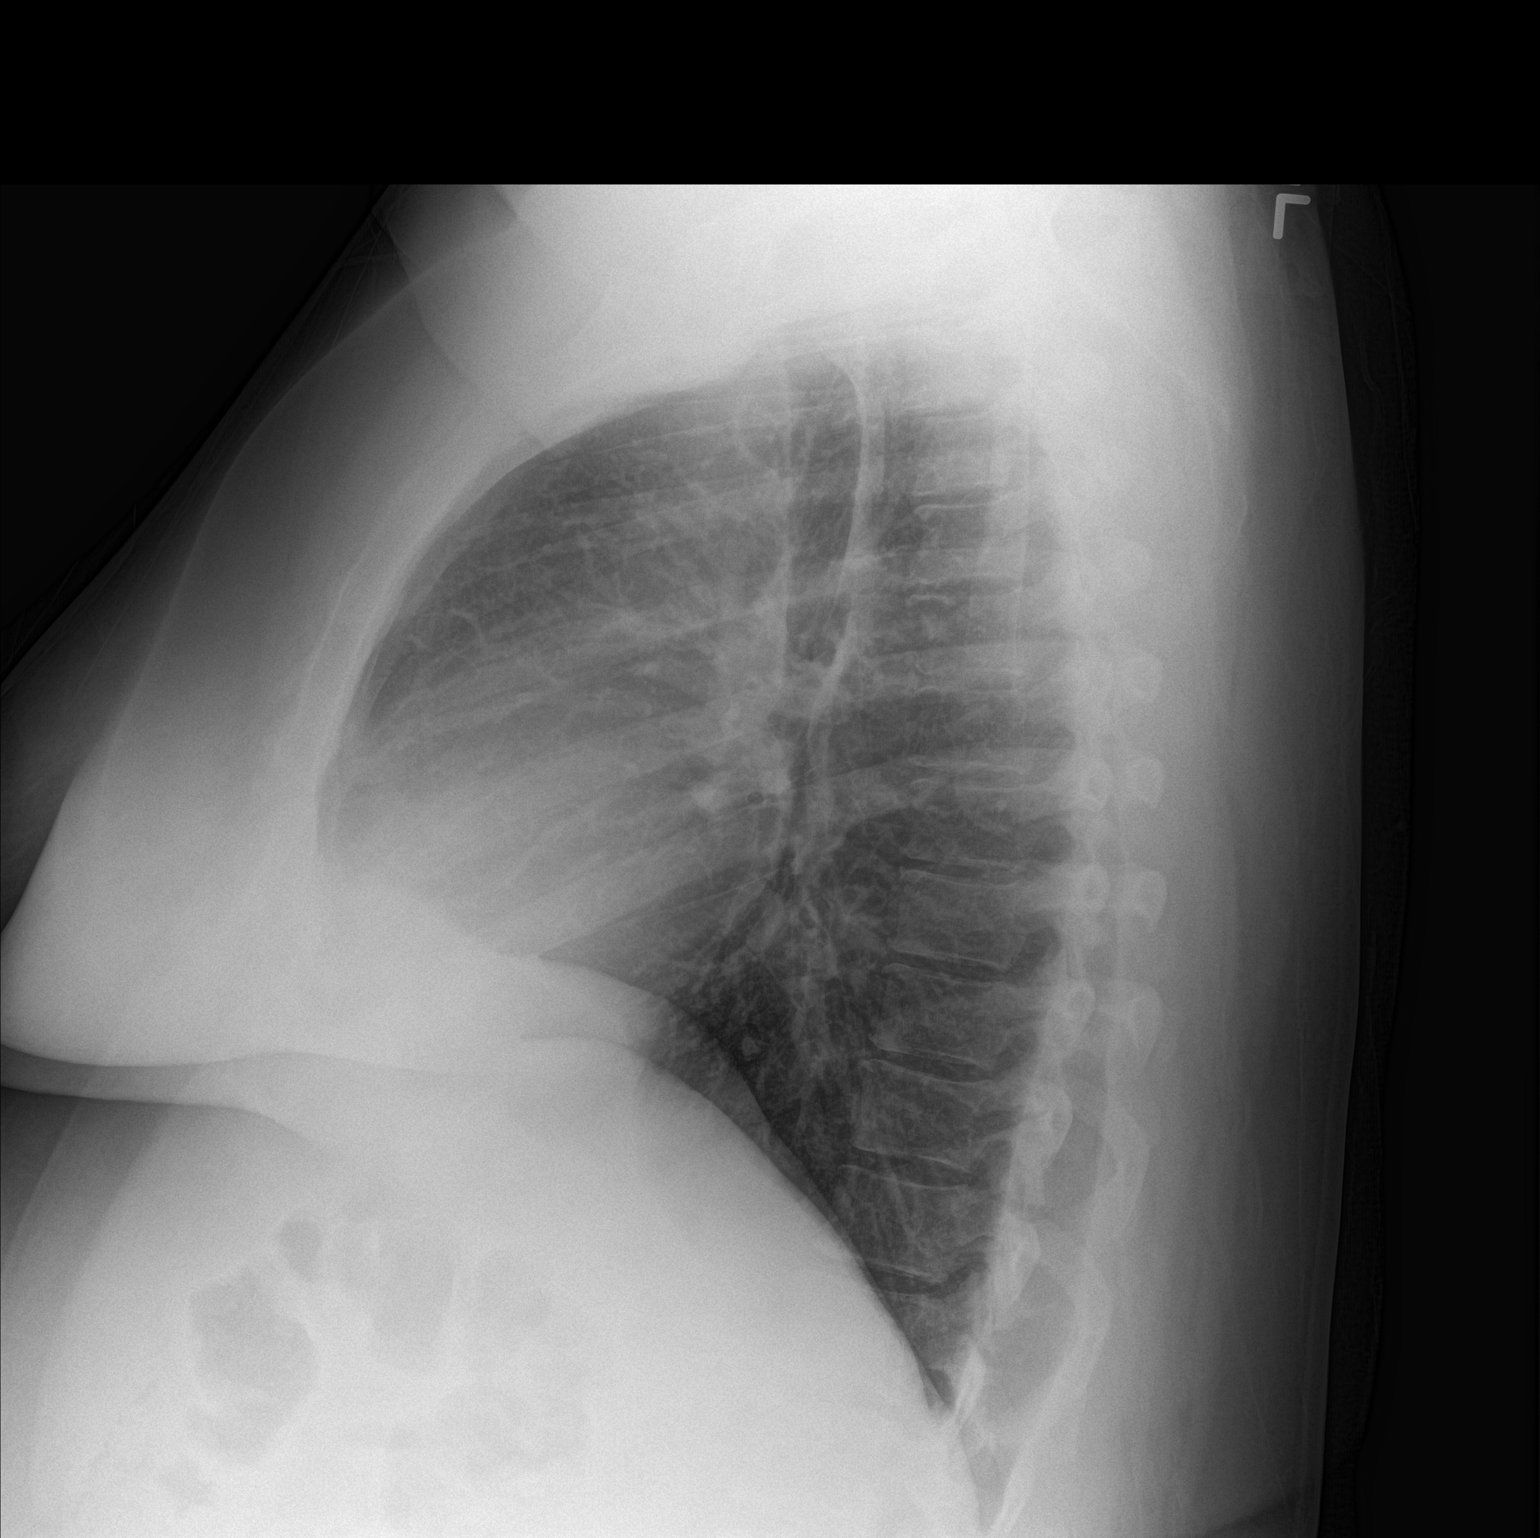

[2 of 2 positions shown; findings below may reference images not displayed]

FINDINGS: Slight fibrosis in the left lung base. Normal heart size and
pulmonary vascularity. No focal airspace disease or consolidation in
the lungs. No blunting of costophrenic angles. No pneumothorax.
Mediastinal contours appear intact.
IMPRESSION: No active cardiopulmonary disease.

## 2015-10-05 MED ORDER — HYDROCOD POLST-CPM POLST ER 10-8 MG/5ML PO SUER: 5.0000 mL | Freq: Every evening | ORAL | Status: DC | PRN

## 2015-10-05 MED ORDER — AZITHROMYCIN 250 MG PO TABS: ORAL_TABLET | ORAL | Status: DC

## 2015-10-05 NOTE — ED Notes (Signed)

## 2015-10-05 NOTE — ED Provider Notes (Signed)
Time Seen: Approximately 0 340 I have reviewed the triage notes  Chief Complaint: Cough   History of Present Illness: Rhonda Barrett is a 40 y.o. female who states that she's had a three-day history of a cough and noticed a subjective fever and headache starting on Friday. Off is been occasionally productive with yellow tinged sputum. She denies any hemoptysis. She denies any tuberculosis risks factors. She denies any Tenderness or swelling. She states that the fevers has been subjective and has not taken her temperature at home. She denies any persistent chest pain.   History reviewed. No pertinent past medical history.  There are no active problems to display for this patient.   Past Surgical History  Procedure Laterality Date  . Tubal ligation      Past Surgical History  Procedure Laterality Date  . Tubal ligation      Current Outpatient Rx  Name  Route  Sig  Dispense  Refill  . azithromycin (ZITHROMAX Z-PAK) 250 MG tablet      Take 2 tablets (500 mg) on  Day 1,  followed by 1 tablet (250 mg) once daily on Days 2 through 5.   6 each   0   . chlorpheniramine-HYDROcodone (TUSSIONEX PENNKINETIC ER) 10-8 MG/5ML SUER   Oral   Take 5 mLs by mouth at bedtime as needed for cough.   50 mL   0   . citalopram (CELEXA) 20 MG tablet   Oral   Take 20 mg by mouth daily.         Marland Kitchen. HYDROcodone-acetaminophen (NORCO) 5-325 MG tablet   Oral   Take 1 tablet by mouth every 6 (six) hours as needed for moderate pain.   15 tablet   0   . ibuprofen (ADVIL,MOTRIN) 800 MG tablet   Oral   Take 1 tablet (800 mg total) by mouth every 8 (eight) hours as needed for moderate pain.   15 tablet   0     Allergies:  Review of patient's allergies indicates no known allergies.  Family History: No family history on file.  Social History: Social History  Substance Use Topics  . Smoking status: Never Smoker   . Smokeless tobacco: None  . Alcohol Use: No     Review of Systems:   10  point review of systems was performed and was otherwise negative:  Constitutional: Subjective fever Eyes: No visual disturbances ENT: No sore throat, ear pain Cardiac: No chest pain Respiratory: Mild shortness of breath, wheezing, or stridor Abdomen: No abdominal pain, no vomiting, No diarrhea Endocrine: No weight loss, No night sweats Extremities: No peripheral edema, cyanosis Skin: No rashes, easy bruising Neurologic: No focal weakness, trouble with speech or swollowing Urologic: No dysuria, Hematuria, or urinary frequency   Physical Exam:  ED Triage Vitals  Enc Vitals Group     BP 10/05/15 0126 144/94 mmHg     Pulse Rate 10/05/15 0126 83     Resp 10/05/15 0126 18     Temp 10/05/15 0126 98.1 F (36.7 C)     Temp Source 10/05/15 0126 Oral     SpO2 10/05/15 0126 98 %     Weight 10/05/15 0127 260 lb (117.935 kg)     Height 10/05/15 0127 5\' 6"  (1.676 m)     Head Cir --      Peak Flow --      Pain Score 10/05/15 0127 5     Pain Loc --      Pain  Edu? --      Excl. in GC? --     General: Awake , Alert , and Oriented times 3; GCS 15 Head: Normal cephalic , atraumatic Eyes: Pupils equal , round, reactive to light Nose/Throat: No nasal drainage, patent upper airway without erythema or exudate.  Neck: Supple, Full range of motion, No anterior adenopathy or palpable thyroid masses Lungs: Clear to ascultation without wheezes , rhonchi, or rales Heart: Regular rate, regular rhythm without murmurs , gallops , or rubs Abdomen: Soft, non tender without rebound, guarding , or rigidity; bowel sounds positive and symmetric in all 4 quadrants. No organomegaly .        Extremities: 2 plus symmetric pulses. No edema, clubbing or cyanosis Neurologic: normal ambulation, Motor symmetric without deficits, sensory intact Skin: warm, dry, no rashes     Radiology   EXAM: CHEST 2 VIEW  COMPARISON: 04/11/2015  FINDINGS: Slight fibrosis in the left lung base. Normal heart size  and pulmonary vascularity. No focal airspace disease or consolidation in the lungs. No blunting of costophrenic angles. No pneumothorax. Mediastinal contours appear intact.  IMPRESSION: No active cardiopulmonary disease.    I personally reviewed the radiologic studies    ED Course:  Patient's stay here was uneventful and I felt laboratory work was not necessary. She does not appear to have any signs of pneumonia on exam or chest x-ray evaluation, however: Her cough is becoming increasingly productive and the cough seems to be her main concern states she has trouble sleeping at night. I prescribed her Zithromax in case this is some form of pertussis along with prescription cough medication. She's been advised drink plenty of fluids and return here if she has a persistently high fever, hemoptysis, or any other new concerns.   Assessment:  Acute bronchitis   Final Clinical Impression:   Final diagnoses:  Acute bronchitis, unspecified organism     Plan: * Outpatient Patient was advised to return immediately if condition worsens. Patient was advised to follow up with their primary care physician or other specialized physicians involved in their outpatient care. The patient and/or family member/power of attorney had laboratory results reviewed at the bedside. All questions and concerns were addressed and appropriate discharge instructions were distributed by the nursing staff.             Jennye Moccasin, MD 10/05/15 [phone]

## 2015-10-05 NOTE — ED Notes (Signed)
Patient to ED for a cough x 3 days. Nausea without vomiting. Denies other sick contacts.

## 2015-10-05 NOTE — ED Notes (Addendum)
Patient reports cough, headache and fever that started Friday. Patient states that she has not checked her fever that she just felt hot. Patient lung sounds are clear.

## 2017-03-09 DIAGNOSIS — N8502 Endometrial intraepithelial neoplasia [EIN]: Secondary | ICD-10-CM | POA: Insufficient documentation

## 2017-05-13 ENCOUNTER — Emergency Department
Admission: EM | Admit: 2017-05-13 | Discharge: 2017-05-13 | Disposition: A | Discharge status: Home / Self Care | Payer: Self-pay | Attending: Emergency Medicine | Admitting: Emergency Medicine

## 2017-05-13 ENCOUNTER — Encounter: Payer: Self-pay | Admitting: Emergency Medicine

## 2017-05-13 DIAGNOSIS — R55 Syncope and collapse: Secondary | ICD-10-CM | POA: Insufficient documentation

## 2017-05-13 DIAGNOSIS — Z79899 Other long term (current) drug therapy: Secondary | ICD-10-CM | POA: Insufficient documentation

## 2017-05-13 HISTORY — DX: Anxiety disorder, unspecified: F41.9

## 2017-05-13 HISTORY — DX: Panic disorder (episodic paroxysmal anxiety): F41.0

## 2017-05-13 LAB — BASIC METABOLIC PANEL
Anion gap: 8 (ref 5–15)
BUN: 9 mg/dL (ref 6–20)
CO2: 25 mmol/L (ref 22–32)
Calcium: 9 mg/dL (ref 8.9–10.3)
Chloride: 104 mmol/L (ref 101–111)
Creatinine, Ser: 0.65 mg/dL (ref 0.44–1.00)
GFR calc Af Amer: >60 mL/min (ref >60)
GFR calc non Af Amer: >60 mL/min (ref >60)
Glucose, Bld: 122 mg/dL — ABNORMAL HIGH (ref 65–99)
Potassium: 2.9 mmol/L — ABNORMAL LOW (ref 3.5–5.1)
SODIUM: 137 mmol/L (ref 135–145)

## 2017-05-13 LAB — CBC
HEMATOCRIT: 42 % (ref 35.0–47.0)
HEMOGLOBIN: 14.1 g/dL (ref 12.0–16.0)
MCH: 27.1 pg (ref 26.0–34.0)
MCHC: 33.6 g/dL (ref 32.0–36.0)
MCV: 80.5 fL (ref 80.0–100.0)
Platelets: 93 10*3/uL — ABNORMAL LOW (ref 150–440)
RBC: 5.21 MIL/uL — ABNORMAL HIGH (ref 3.80–5.20)
RDW: 15.6 % — AB (ref 11.5–14.5)
WBC: 7.9 10*3/uL (ref 3.6–11.0)

## 2017-05-13 LAB — URINALYSIS, COMPLETE (UACMP) WITH MICROSCOPIC
Bacteria, UA: NONE SEEN
Bilirubin Urine: NEGATIVE
Glucose, UA: NEGATIVE mg/dL
KETONES UR: NEGATIVE mg/dL
Nitrite: NEGATIVE
PH: 6 (ref 5.0–8.0)
Protein, ur: 30 mg/dL — AB
Specific Gravity, Urine: 1.005 (ref 1.005–1.030)

## 2017-05-13 LAB — POCT PREGNANCY, URINE: Preg Test, Ur: NEGATIVE

## 2017-05-13 NOTE — ED Provider Notes (Addendum)
[facility] Emergency Department Provider Note  Time seen: 7:56 PM  I have reviewed the triage vital signs and the nursing notes.   HISTORY  Chief Complaint Near Syncope    HPI Myra Rudena A Peyser is a 41 y.o. female with a past medical history of anxiety who presents to the emergency department after near syncopal episode.  According to the patient they recently found out that her daughter's fetus had died.  The daughter is currently admitted to the hospital.  The patient states she has been staying with the daughter she has not been sleeping eating or drinking much.  Today she states that daughter was not feeling well began vomiting, the patient attempted to get up to help her but felt very lightheaded and dizzy and had to be lowered to the ground by family members.  Patient states they checked her blood pressure while she was upstairs and it was 190 systolic and they recommended she come down to the emergency department for evaluation.  Patient denies any chest pain or trouble breathing at any point.  Denies any history of syncope in the past.  He denies any abdominal pain.  Currently feels back to normal besides feeling fatigued.   Past Medical History:  Diagnosis Date  . Anxiety   . Panic attack     There are no active problems to display for this patient.   Past Surgical History:  Procedure Laterality Date  . TUBAL LIGATION      Prior to Admission medications   Medication Sig Start Date End Date Taking? Authorizing Provider  azithromycin (ZITHROMAX Z-PAK) 250 MG tablet Take 2 tablets (500 mg) on  Day 1,  followed by 1 tablet (250 mg) once daily on Days 2 through 5. 10/05/15   Jennye MoccasinQuigley, Brian S, MD  chlorpheniramine-HYDROcodone [facility](TUSSIONEX PENNKINETIC ER) 10-8 MG/5ML SUER Take 5 mLs by mouth at bedtime as needed for cough. 10/05/15   Jennye MoccasinQuigley, Brian S, MD  citalopram (CELEXA) 20 MG tablet Take 20 mg by mouth daily.    [provider]  HYDROcodone-acetaminophen  (NORCO) 5-325 MG tablet Take 1 tablet by mouth every 6 (six) hours as needed for moderate pain. 04/11/15   Irean HongSung, Jade J, MD  ibuprofen (ADVIL,MOTRIN) 800 MG tablet Take 1 tablet (800 mg total) by mouth every 8 (eight) hours as needed for moderate pain. 04/11/15   Irean HongSung, Jade J, MD    No Known Allergies  No family history on file.  Social History Social History   Tobacco Use  . Smoking status: Never Smoker  . Smokeless tobacco: Never Used  Substance Use Topics  . Alcohol use: No  . Drug use: No    Review of Systems Constitutional: Positive for near syncope/dizziness/light headedness Cardiovascular: Negative for chest pain. Respiratory: Negative for shortness of breath. Gastrointestinal: Negative for abdominal pain Neurological: Headache, now resolved.  Denies focal weakness or numbness. All other ROS negative  ____________________________________________   PHYSICAL EXAM:  VITAL SIGNS: ED Triage Vitals [05/13/17 1848]  Enc Vitals Group     BP (!) 163/85     Pulse Rate 98     Resp 16     Temp 98 F (36.7 C)     Temp Source Oral     SpO2 99 %     Weight 230 lb (104.3 kg)     Height 5\' 6"  (1.676 m)     Head Circumference      Peak Flow      Pain Score 0  Pain Loc      Pain Edu?      Excl. in GC?    Constitutional: Alert and oriented. Well appearing and in no distress. Eyes: Normal exam ENT   Head: Normocephalic and atraumatic.   Mouth/Throat: Mucous membranes are moist. Cardiovascular: Normal rate, regular rhythm. No murmur Respiratory: Normal respiratory effort without tachypnea nor retractions. Breath sounds are clear Gastrointestinal: Soft and nontender. No distention.   Musculoskeletal: Nontender with normal range of motion in all extremities. Neurologic:  Normal speech and language. No gross focal neurologic deficits Skin:  Skin is warm, dry and intact.  Psychiatric: Mood and affect are normal.   ____________________________________________     EKG  EKG reviewed and interpreted by myself shows normal sinus rhythm at 99 bpm, narrow QRS, normal axis, normal intervals, nonspecific ST changes.  ____________________________________________   INITIAL IMPRESSION / ASSESSMENT AND PLAN / ED COURSE  Pertinent labs & imaging results that were available during my care of the patient were reviewed by me and considered in my medical decision making (see chart for details).  Patient presents to the emergency department after near syncopal episode.  Differential would include near syncope, vasovagal syncope, orthostatic syncope, anxiety, metabolic abnormality, infectious etiology, electrolyte abnormality.  Overall the patient appears extremely well, with a normal physical exam.  Patient's lab work is reassuring.  Mild hypokalemia.  Urinalysis pending.  Urinalysis shows too numerous to count red blood cells with minimal white blood cells.  Urine culture has been sent we will hold off on antibiotics as the patient has no urinary complaints at this time.  Patient states she is currently on her menstrual cycle.  ____________________________________________   FINAL CLINICAL IMPRESSION(S) / ED DIAGNOSES  Near syncope    Minna AntisPaduchowski, Nymir Ringler, MD 05/13/17 [id number]    Minna AntisPaduchowski, Toma Arts, MD 05/13/17 [phone]

## 2017-05-13 NOTE — ED Triage Notes (Signed)
Pt to ED , pt was visiting daughter upstairs in L&D when pt had syncopal episode in room. Pt states increased anxiety, has not had anything to eat since yesterday. Pt A&OX4, denies CP. Hx of anxiety

## 2017-05-13 NOTE — ED Notes (Signed)
First nurse note  Brought to ED via w/c from 3 rd   Staff states she was visiting family  Became dizzy and fell to floor  Hitting head

## 2017-05-13 NOTE — ED Notes (Signed)
Jeannett SeniorStephen RN aware, Lab needs recollect of urine

## 2017-05-15 LAB — URINE CULTURE

## 2018-10-20 DIAGNOSIS — Z30431 Encounter for routine checking of intrauterine contraceptive device: Secondary | ICD-10-CM

## 2018-10-20 HISTORY — DX: Encounter for routine checking of intrauterine contraceptive device: Z30.431

## 2018-11-25 ENCOUNTER — Encounter: Payer: Self-pay | Admitting: Emergency Medicine

## 2018-11-25 ENCOUNTER — Other Ambulatory Visit: Payer: Self-pay

## 2018-11-25 ENCOUNTER — Emergency Department
Admission: EM | Admit: 2018-11-25 | Discharge: 2018-11-25 | Disposition: A | Discharge status: Home / Self Care | Payer: Self-pay | Attending: Emergency Medicine | Admitting: Emergency Medicine

## 2018-11-25 DIAGNOSIS — N390 Urinary tract infection, site not specified: Secondary | ICD-10-CM | POA: Insufficient documentation

## 2018-11-25 DIAGNOSIS — Z79899 Other long term (current) drug therapy: Secondary | ICD-10-CM | POA: Insufficient documentation

## 2018-11-25 LAB — CBC
HCT: 39.6 % (ref 36.0–46.0)
Hemoglobin: 12.8 g/dL (ref 12.0–15.0)
MCH: 26.6 pg (ref 26.0–34.0)
MCHC: 32.3 g/dL (ref 30.0–36.0)
MCV: 82.3 fL (ref 80.0–100.0)
Platelets: 245 10*3/uL (ref 150–400)
RBC: 4.81 MIL/uL (ref 3.87–5.11)
RDW: 13.8 % (ref 11.5–15.5)
WBC: 7.6 10*3/uL (ref 4.0–10.5)
nRBC: 0 % (ref 0.0–0.2)

## 2018-11-25 LAB — COMPREHENSIVE METABOLIC PANEL
ALT: 19 U/L (ref 0–44)
AST: 21 U/L (ref 15–41)
Albumin: 3.6 g/dL (ref 3.5–5.0)
Alkaline Phosphatase: 112 U/L (ref 38–126)
Anion gap: 8 (ref 5–15)
BUN: 9 mg/dL (ref 6–20)
CO2: 27 mmol/L (ref 22–32)
Calcium: 8.6 mg/dL — ABNORMAL LOW (ref 8.9–10.3)
Chloride: 104 mmol/L (ref 98–111)
Creatinine, Ser: 0.74 mg/dL (ref 0.44–1.00)
GFR calc Af Amer: >60 mL/min (ref >60)
GFR calc non Af Amer: >60 mL/min (ref >60)
Glucose, Bld: 117 mg/dL — ABNORMAL HIGH (ref 70–99)
Potassium: 3.8 mmol/L (ref 3.5–5.1)
Sodium: 139 mmol/L (ref 135–145)
Total Bilirubin: 0.5 mg/dL (ref 0.3–1.2)
Total Protein: 7.3 g/dL (ref 6.5–8.1)

## 2018-11-25 LAB — URINALYSIS, COMPLETE (UACMP) WITH MICROSCOPIC
Bacteria, UA: NONE SEEN
Bilirubin Urine: NEGATIVE
Glucose, UA: NEGATIVE mg/dL
Ketones, ur: NEGATIVE mg/dL
Nitrite: NEGATIVE
Protein, ur: 100 mg/dL — AB
Specific Gravity, Urine: 1.023 (ref 1.005–1.030)
WBC, UA: >50 WBC/hpf — ABNORMAL HIGH (ref 0–5)
pH: 6 (ref 5.0–8.0)

## 2018-11-25 LAB — LIPASE, BLOOD: Lipase: 36 U/L (ref 11–51)

## 2018-11-25 LAB — POCT PREGNANCY, URINE: Preg Test, Ur: NEGATIVE

## 2018-11-25 MED ORDER — PHENAZOPYRIDINE HCL 200 MG PO TABS
200.0000 mg | ORAL_TABLET | Freq: Once | ORAL | Status: AC
  Administered 2018-11-25: 200 mg via ORAL
  Filled 2018-11-25: qty 1

## 2018-11-25 MED ORDER — KETOROLAC TROMETHAMINE 60 MG/2ML IM SOLN
60.0000 mg | Freq: Once | INTRAMUSCULAR | Status: AC
  Administered 2018-11-25: 60 mg via INTRAMUSCULAR
  Filled 2018-11-25: qty 2

## 2018-11-25 MED ORDER — SULFAMETHOXAZOLE-TRIMETHOPRIM 800-160 MG PO TABS: 1.0000 | ORAL_TABLET | Freq: Two times a day (BID) | ORAL | 0 refills | Status: DC

## 2018-11-25 MED ORDER — SULFAMETHOXAZOLE-TRIMETHOPRIM 800-160 MG PO TABS
1.0000 | ORAL_TABLET | Freq: Once | ORAL | Status: AC
  Administered 2018-11-25: 1 via ORAL
  Filled 2018-11-25: qty 1

## 2018-11-25 MED ORDER — PHENAZOPYRIDINE HCL 200 MG PO TABS: 200.0000 mg | ORAL_TABLET | Freq: Three times a day (TID) | ORAL | 0 refills | Status: AC | PRN

## 2018-11-25 MED ORDER — SODIUM CHLORIDE 0.9% FLUSH: 3.0000 mL | Freq: Once | INTRAVENOUS | Status: DC

## 2018-11-25 MED ORDER — TRAMADOL HCL 50 MG PO TABS: 50.0000 mg | ORAL_TABLET | Freq: Four times a day (QID) | ORAL | 0 refills | Status: AC | PRN

## 2018-11-25 NOTE — ED Triage Notes (Signed)
Pt to ED via POV c/o LLQ abd pain and pelvic pain. Pt also states that she has been seeing blood in her urine. Pt states that she called her PCP who advised her to come to ED for evaluation. Pt is in NAD.

## 2018-11-25 NOTE — ED Triage Notes (Signed)
First Nurse Note:  C/O left sided abdominal pain.  AAOx3.  Skin warm and dry. Posture upright and relaxed.  NAD

## 2018-11-25 NOTE — ED Provider Notes (Signed)
[facility] Emergency Department Provider Note   ____________________________________________   First MD Initiated Contact with Patient 11/25/18 2017     (approximate)  I have reviewed the triage vital signs and the nursing notes.   HISTORY  Chief Complaint Abdominal Pain    HPI Rhonda Barrett is a 43 y.o. female patient presents with left-sided abdominal pain, urinary frequency, urinary urgency and mild dysuria.  Patient denies vaginal discharge, flank pain, fever.  Patient denies nausea, vomiting, diarrhea.  Patient states she was treated 1 week ago for [facility]  Patient states she was treated for 5 days of antibiotics but did not feel she improved.     Past Medical History:  Diagnosis Date  . Anxiety   . Panic attack     There are no active problems to display for this patient.   Past Surgical History:  Procedure Laterality Date  . TUBAL LIGATION      Prior to Admission medications   Medication Sig Start Date End Date Taking? Authorizing Provider  azithromycin (ZITHROMAX Z-PAK) 250 MG tablet Take 2 tablets (500 mg) on  Day 1,  followed by 1 tablet (250 mg) once daily on Days 2 through 5. 10/05/15   Jennye MoccasinQuigley, Brian S, MD  chlorpheniramine-HYDROcodone [facility](TUSSIONEX PENNKINETIC ER) 10-8 MG/5ML SUER Take 5 mLs by mouth at bedtime as needed for cough. 10/05/15   Jennye MoccasinQuigley, Brian S, MD  citalopram (CELEXA) 20 MG tablet Take 20 mg by mouth daily.    [provider]  HYDROcodone-acetaminophen (NORCO) 5-325 MG tablet Take 1 tablet by mouth every 6 (six) hours as needed for moderate pain. 04/11/15   Irean HongSung, Jade J, MD  ibuprofen (ADVIL,MOTRIN) 800 MG tablet Take 1 tablet (800 mg total) by mouth every 8 (eight) hours as needed for moderate pain. 04/11/15   Irean HongSung, Jade J, MD  phenazopyridine (PYRIDIUM) 200 MG tablet Take 1 tablet (200 mg total) by mouth 3 (three) times daily as needed for pain. 11/25/18   Joni ReiningSmith, Mafalda Mcginniss K,  PA-C  sulfamethoxazole-trimethoprim (BACTRIM DS) 800-160 MG tablet Take 1 tablet by mouth 2 (two) times daily. 11/25/18   Joni ReiningSmith, Murdock Jellison K, PA-C  traMADol (ULTRAM) 50 MG tablet Take 1 tablet (50 mg total) by mouth every 6 (six) hours as needed for up to 3 days. 11/25/18 11/28/18  Joni ReiningSmith, Devonne Lalani K, PA-C    Allergies Patient has no known allergies.  No family history on file.  Social History Social History   Tobacco Use  . Smoking status: Never Smoker  . Smokeless tobacco: Never Used  Substance Use Topics  . Alcohol use: No  . Drug use: No    Review of Systems Constitutional: No fever/chills Eyes: No visual changes. ENT: No sore throat. Cardiovascular: Denies chest pain. Respiratory: Denies shortness of breath. Gastrointestinal: Left lower abdominal pain.  No nausea, no vomiting.  No diarrhea.  No constipation. Genitourinary: Positive for dysuria. Musculoskeletal: Negative for back pain. Skin: Negative for rash. Neurological: Negative for headaches, focal weakness or numbness. Psychiatric:  Anxiety    ____________________________________________   PHYSICAL EXAM:  VITAL SIGNS: ED Triage Vitals  Enc Vitals Group     BP 11/25/18 1706 (!) 145/76     Pulse Rate 11/25/18 1702 82     Resp 11/25/18 1702 16     Temp 11/25/18 1702 99.1 F (37.3 C)     Temp Source 11/25/18 1702 Oral     SpO2 11/25/18 1702 97 %  Weight 11/25/18 1702 240 lb (108.9 kg)     Height 11/25/18 1702 5\' 6"  (1.676 m)     Head Circumference --      Peak Flow --      Pain Score 11/25/18 1702 0     Pain Loc --      Pain Edu? --      Excl. in GC? --    Constitutional: Alert and oriented. Well appearing and in no acute distress.  Morbid obesity. Cardiovascular: Normal rate, regular rhythm. Grossly normal heart sounds.  Good peripheral circulation. Respiratory: Normal respiratory effort.  No retractions. Lungs CTAB. Gastrointestinal: Soft and nontender. No distention. No abdominal bruits. No CVA  tenderness. Genitourinary: Deferred Musculoskeletal: No lower extremity tenderness nor edema.  No joint effusions. Neurologic:  Normal speech and language. No gross focal neurologic deficits are appreciated. No gait instability. Skin:  Skin is warm, dry and intact. No rash noted. Psychiatric: Mood and affect are normal. Speech and behavior are normal.  ____________________________________________   LABS (all labs ordered are listed, but only abnormal results are displayed)  Labs Reviewed  COMPREHENSIVE METABOLIC PANEL - Abnormal; Notable for the following components:      Result Value   Glucose, Bld 117 (*)    Calcium 8.6 (*)    All other components within normal limits  URINALYSIS, COMPLETE (UACMP) WITH MICROSCOPIC - Abnormal; Notable for the following components:   Color, Urine YELLOW (*)    APPearance CLOUDY (*)    Hgb urine dipstick LARGE (*)    Protein, ur 100 (*)    Leukocytes,Ua MODERATE (*)    WBC, UA >50 (*)    All other components within normal limits  URINE CULTURE  LIPASE, BLOOD  CBC  POCT PREGNANCY, URINE  POC URINE PREG, ED   ____________________________________________  EKG   ____________________________________________  RADIOLOGY  ED MD interpretation:    Official radiology report(s): No results found.  ____________________________________________   PROCEDURES  Procedure(s) performed (including Critical Care):  Procedures   ____________________________________________   INITIAL IMPRESSION / ASSESSMENT AND PLAN / ED COURSE  As part of my medical decision making, I reviewed the following data within the electronic MEDICAL RECORD NUMBER        Rhonda Barrett was evaluated in Emergency Department on 11/25/2018 for the symptoms described in the history of present illness. She was evaluated in the context of the global COVID-19 pandemic, which necessitated consideration that the patient might be at risk for infection with the SARS-CoV-2 virus that  causes COVID-19. Institutional protocols and algorithms that pertain to the evaluation of patients at risk for COVID-19 are in a state of rapid change based on information released by regulatory bodies including the CDC and federal and state organizations. These policies and algorithms were followed during the patient's care in the ED.  Patient presents with urinary frequency, urgency, and dysuria.  Discussed lab results with patient was consistent with urinary tract infection.  Patient given discharge care instruction and advised that urine culture is pending.  Take medication as directed and have urine retested in 10 days.   ____________________________________________   FINAL CLINICAL IMPRESSION(S) / ED DIAGNOSES  Final diagnoses:  Lower urinary tract infectious disease     ED Discharge Orders         Ordered    sulfamethoxazole-trimethoprim (BACTRIM DS) 800-160 MG tablet  2 times daily     11/25/18 2026    phenazopyridine (PYRIDIUM) 200 MG tablet  3 times daily PRN  11/25/18 2026    traMADol (ULTRAM) 50 MG tablet  Every 6 hours PRN     11/25/18 2026           Note:  This document was prepared using Dragon voice recognition software and may include unintentional dictation errors.    Sable Feil, PA-C 11/25/18 2031    Earleen Newport, MD 11/25/18 [phone]

## 2018-11-27 LAB — URINE CULTURE: Special Requests: NORMAL

## 2019-01-26 DIAGNOSIS — R300 Dysuria: Secondary | ICD-10-CM | POA: Insufficient documentation

## 2019-06-26 ENCOUNTER — Emergency Department
Admission: EM | Admit: 2019-06-26 | Discharge: 2019-06-26 | Disposition: A | Discharge status: Home / Self Care | Payer: Self-pay | Attending: Emergency Medicine | Admitting: Emergency Medicine

## 2019-06-26 ENCOUNTER — Other Ambulatory Visit: Payer: Self-pay

## 2019-06-26 ENCOUNTER — Emergency Department: Payer: Self-pay

## 2019-06-26 DIAGNOSIS — Z79899 Other long term (current) drug therapy: Secondary | ICD-10-CM | POA: Insufficient documentation

## 2019-06-26 DIAGNOSIS — R1084 Generalized abdominal pain: Secondary | ICD-10-CM | POA: Insufficient documentation

## 2019-06-26 DIAGNOSIS — R109 Unspecified abdominal pain: Secondary | ICD-10-CM

## 2019-06-26 DIAGNOSIS — E282 Polycystic ovarian syndrome: Secondary | ICD-10-CM | POA: Insufficient documentation

## 2019-06-26 DIAGNOSIS — Z85528 Personal history of other malignant neoplasm of kidney: Secondary | ICD-10-CM | POA: Insufficient documentation

## 2019-06-26 LAB — URINALYSIS, COMPLETE (UACMP) WITH MICROSCOPIC
Bilirubin Urine: NEGATIVE
Glucose, UA: NEGATIVE mg/dL
Ketones, ur: NEGATIVE mg/dL
Nitrite: NEGATIVE
Protein, ur: NEGATIVE mg/dL
Specific Gravity, Urine: 1.008 (ref 1.005–1.030)
pH: 6 (ref 5.0–8.0)

## 2019-06-26 LAB — CBC
HCT: 38.3 % (ref 36.0–46.0)
Hemoglobin: 13 g/dL (ref 12.0–15.0)
MCH: 27.1 pg (ref 26.0–34.0)
MCHC: 33.9 g/dL (ref 30.0–36.0)
MCV: 79.8 fL — ABNORMAL LOW (ref 80.0–100.0)
Platelets: 228 10*3/uL (ref 150–400)
RBC: 4.8 MIL/uL (ref 3.87–5.11)
RDW: 13.9 % (ref 11.5–15.5)
WBC: 9 10*3/uL (ref 4.0–10.5)
nRBC: 0 % (ref 0.0–0.2)

## 2019-06-26 LAB — COMPREHENSIVE METABOLIC PANEL
ALT: 14 U/L (ref 0–44)
AST: 16 U/L (ref 15–41)
Albumin: 3.5 g/dL (ref 3.5–5.0)
Alkaline Phosphatase: 110 U/L (ref 38–126)
Anion gap: 7 (ref 5–15)
BUN: 11 mg/dL (ref 6–20)
CO2: 27 mmol/L (ref 22–32)
Calcium: 8.6 mg/dL — ABNORMAL LOW (ref 8.9–10.3)
Chloride: 104 mmol/L (ref 98–111)
Creatinine, Ser: 0.58 mg/dL (ref 0.44–1.00)
GFR calc Af Amer: >60 mL/min (ref >60)
GFR calc non Af Amer: >60 mL/min (ref >60)
Glucose, Bld: 97 mg/dL (ref 70–99)
Potassium: 3.2 mmol/L — ABNORMAL LOW (ref 3.5–5.1)
Sodium: 138 mmol/L (ref 135–145)
Total Bilirubin: 0.9 mg/dL (ref 0.3–1.2)
Total Protein: 7.4 g/dL (ref 6.5–8.1)

## 2019-06-26 LAB — POCT PREGNANCY, URINE: Preg Test, Ur: NEGATIVE

## 2019-06-26 LAB — LIPASE, BLOOD: Lipase: 30 U/L (ref 11–51)

## 2019-06-26 IMAGING — CT CT ABD-PELV W/ CM
2 of 5 series · 16 of 46 positions shown, 18 images · IV contrast (APPLIED)
Comparison: Abdominal CT report from [HOSPITAL] 10/06/2018

CLINICAL DATA: Left lower quadrant and left flank pain. History of
renal cell carcinoma treated with surgery

EXAM:
CT ABDOMEN AND PELVIS WITH CONTRAST
TECHNIQUE: Multidetector CT imaging of the abdomen and pelvis was performed
using the standard protocol following bolus administration of
intravenous contrast.
CONTRAST:  125mL OMNIPAQUE IOHEXOL 300 MG/ML  SOLN

[Series 2: routine abd/pel with · axial · 0.88mm/px · z∈[-876,-421]mm · 13 of 106 slices shown, 15 images]
[im 8/106  soft-tissue]
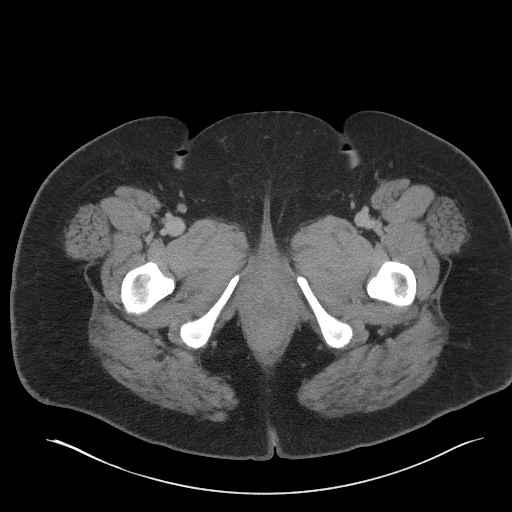
[im 8/106  bone]
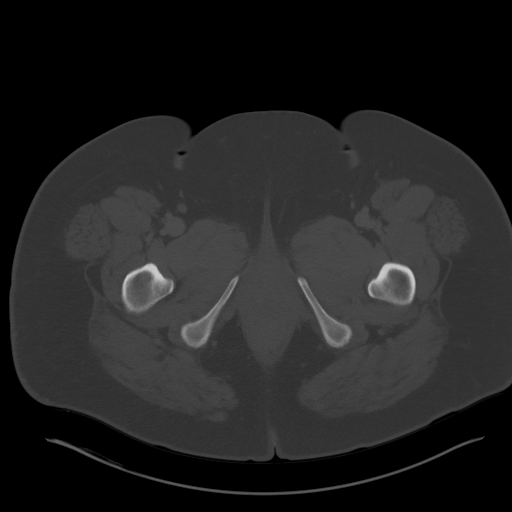
[im 15/106  soft-tissue]
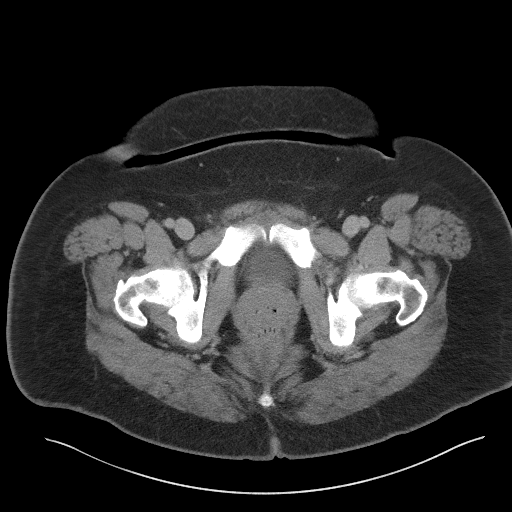
[im 22/106  soft-tissue]
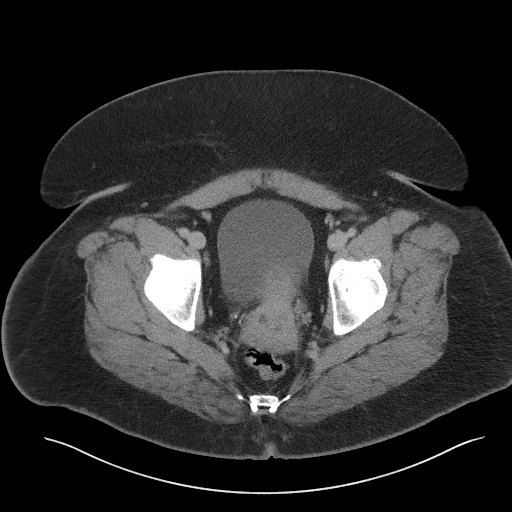
[im 29/106  soft-tissue]
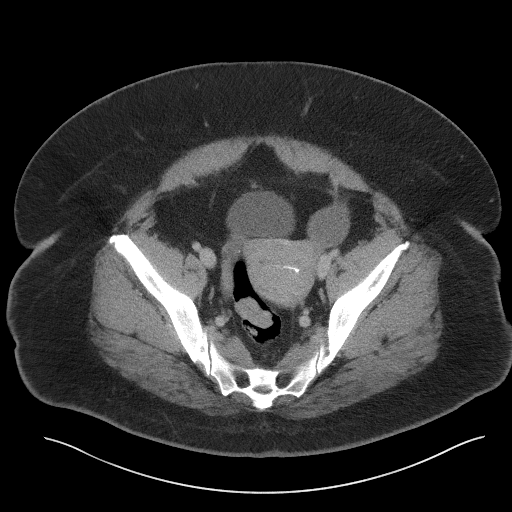
[im 36/106  soft-tissue]
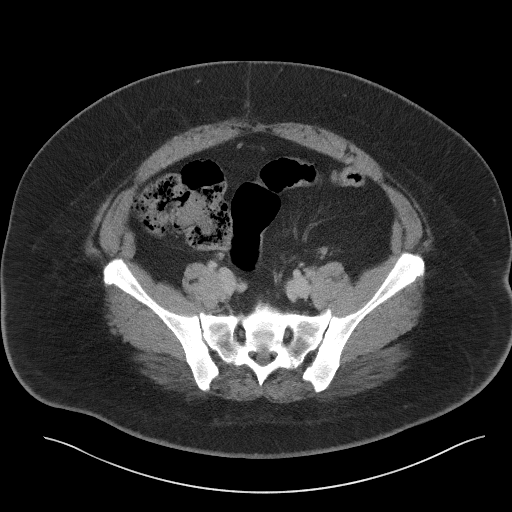
[im 43/106  soft-tissue]
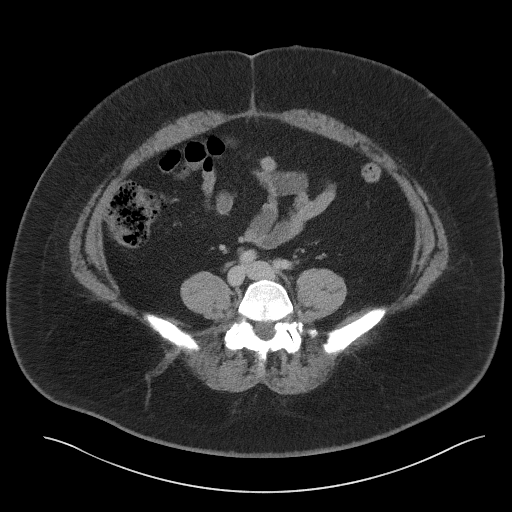
[im 57/106  soft-tissue]
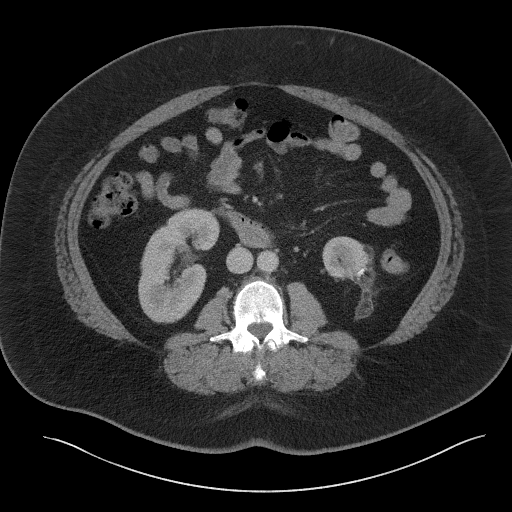
[im 64/106  soft-tissue]
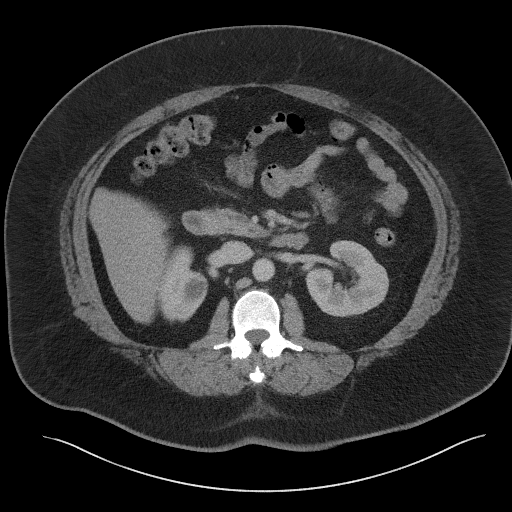
[im 71/106  soft-tissue]
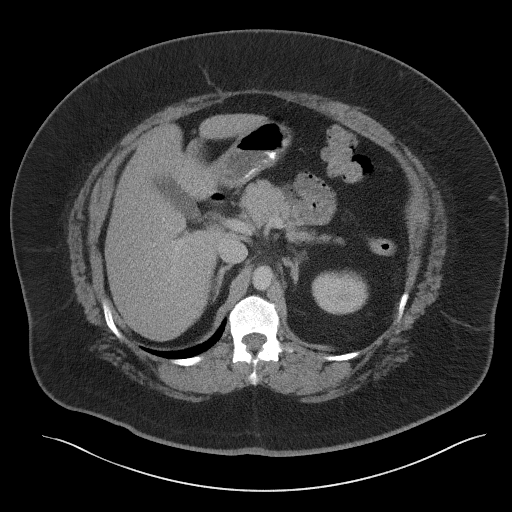
[im 71/106  bone]
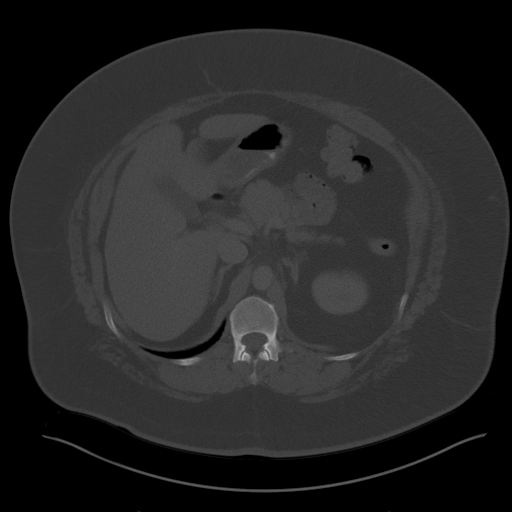
[im 78/106  soft-tissue]
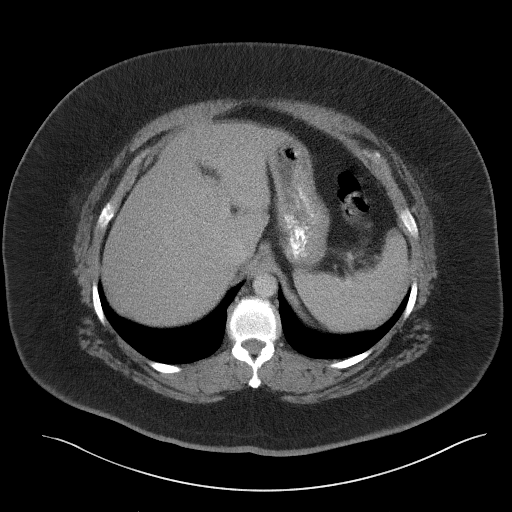
[im 85/106  soft-tissue]
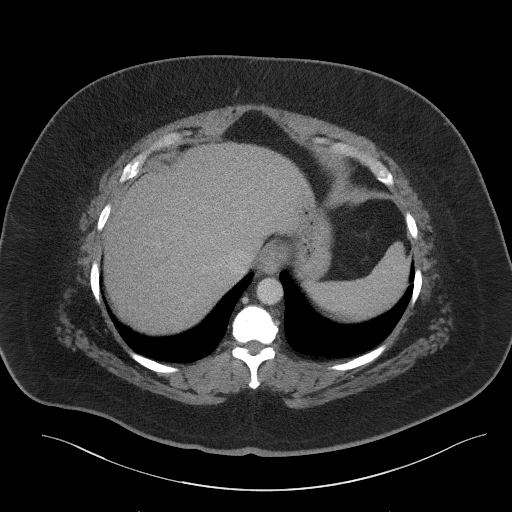
[im 92/106  soft-tissue]
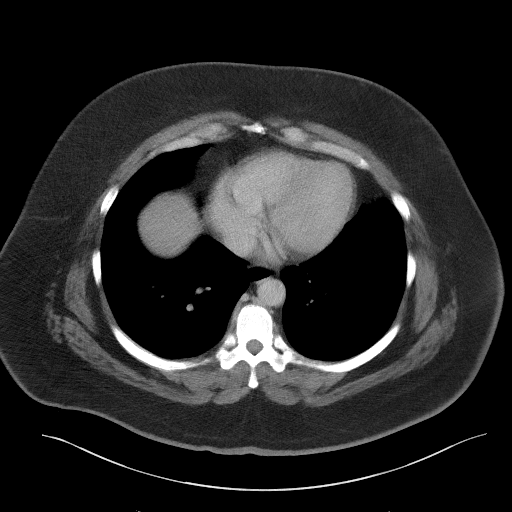
[im 99/106  soft-tissue]
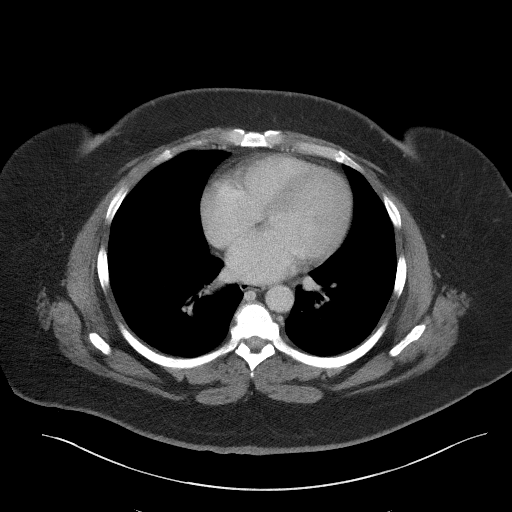

[Series 6: coronal st · coronal · 0.97mm/px · 3 of 128 slices shown]
[im 43/128  soft-tissue]
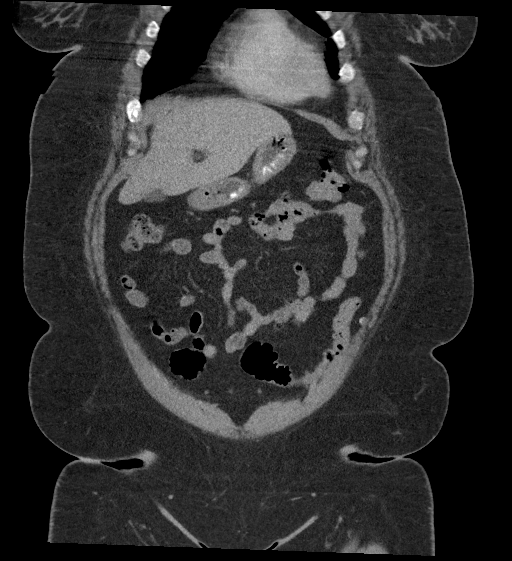
[im 57/128  soft-tissue]
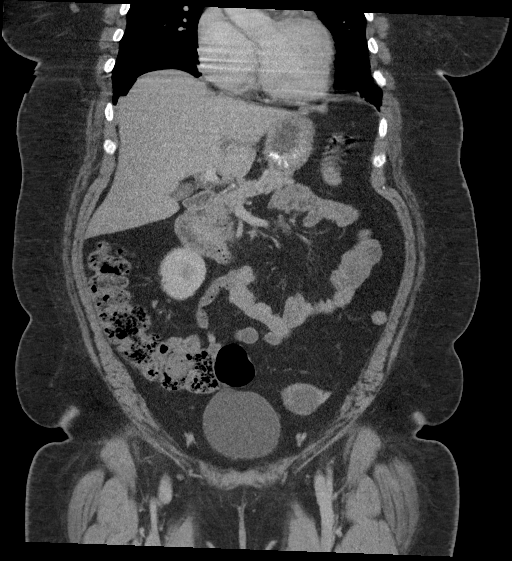
[im 71/128  soft-tissue]
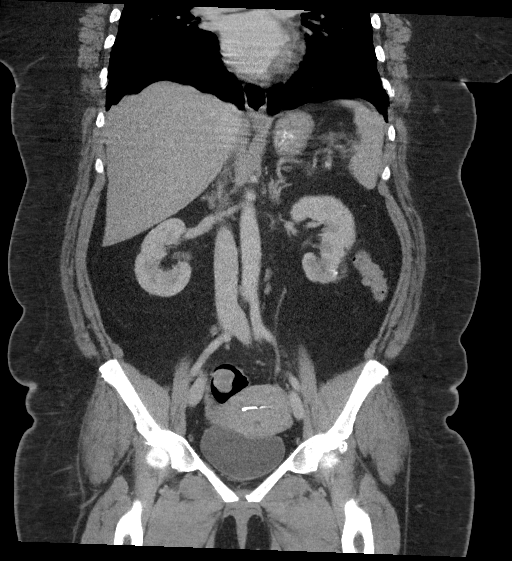

[16 of 46 positions shown; findings below may reference images not displayed]

FINDINGS: Lower chest: Patchy mosaic attenuation in the lower lungs with
appearance of air trapping. There is a 4 mm pulmonary nodule at the
right middle lobe, stable from CT report. Calcified granuloma is in
the left lower lobe.

Hepatobiliary: No focal liver abnormality.No evidence of biliary
obstruction or stone.

Pancreas: Unremarkable.

Spleen: Unremarkable.

Adrenals/Urinary Tract: Negative adrenals. No hydronephrosis or
stone. Left lower pole partial nephrectomy with fat necrosis
appearance that is stable from outside CT description. Remaining
parenchyma is symmetrically enhancing with no evidence of
inflammation, mass, or urine leak. Simple right upper pole cyst
measuring 16 mm. No hydronephrosis.

Stomach/Bowel:  No obstruction. No evidence of bowel inflammation

Vascular/Lymphatic: No acute vascular abnormality. No mass or
adenopathy.

Reproductive:IUD in expected position. Bilateral ovarian cystic
densities measuring up to 2.7 cm on the left, age suggesting these
are follicular.

Other: No ascites or pneumoperitoneum.

Musculoskeletal: No acute finding.  Spinal degeneration.
IMPRESSION: 1. No acute finding or specific explanation for symptoms.
2. Unremarkable appearance of left partial nephrectomy site that
correlates with description on outside abdominal CT report September 2018.
3. Patchy air trapping at the lung bases.

## 2019-06-26 MED ORDER — IBUPROFEN 600 MG PO TABS: 600.0000 mg | ORAL_TABLET | Freq: Three times a day (TID) | ORAL | 0 refills | Status: AC

## 2019-06-26 MED ORDER — HYDROCODONE-ACETAMINOPHEN 5-325 MG PO TABS: 1.0000 | ORAL_TABLET | Freq: Four times a day (QID) | ORAL | 0 refills | Status: DC | PRN

## 2019-06-26 MED ORDER — SODIUM CHLORIDE 0.9 % IV BOLUS
1000.0000 mL | Freq: Once | INTRAVENOUS | Status: AC
  Administered 2019-06-26: 1000 mL via INTRAVENOUS

## 2019-06-26 MED ORDER — IOHEXOL 300 MG/ML  SOLN
125.0000 mL | Freq: Once | INTRAMUSCULAR | Status: AC | PRN
  Administered 2019-06-26: 125 mL via INTRAVENOUS

## 2019-06-26 MED ORDER — POTASSIUM CHLORIDE CRYS ER 20 MEQ PO TBCR
40.0000 meq | EXTENDED_RELEASE_TABLET | Freq: Once | ORAL | Status: AC
  Administered 2019-06-26: 08:00:00 40 meq via ORAL
  Filled 2019-06-26: qty 2

## 2019-06-26 MED ORDER — KETOROLAC TROMETHAMINE 30 MG/ML IJ SOLN
15.0000 mg | Freq: Once | INTRAMUSCULAR | Status: AC
  Administered 2019-06-26: 15 mg via INTRAVENOUS
  Filled 2019-06-26: qty 1

## 2019-06-26 MED ORDER — ONDANSETRON HCL 4 MG/2ML IJ SOLN
4.0000 mg | Freq: Once | INTRAMUSCULAR | Status: AC
  Administered 2019-06-26: 4 mg via INTRAVENOUS
  Filled 2019-06-26: qty 2

## 2019-06-26 NOTE — ED Notes (Signed)
Pt transported to CT ?

## 2019-06-26 NOTE — ED Provider Notes (Signed)
[facility] Emergency Department Provider Note  ____________________________________________   First MD Initiated Contact with Patient 06/26/19 0700     (approximate)  I have reviewed the triage vital signs and the nursing notes.   HISTORY  Chief Complaint Abdominal Pain    HPI Rhonda Barrett is a 44 y.o. female with h/o renal cell CA on left, endometriosis, here with left flank pain. Pt reports that over the past 1 month, she's had progressively worsening left lower quadrant pain.  The patient states that the pain is primarily in the left lower and left upper quadrant but occasionally radiates towards her suprapubic area.  She said associated increasing urinary frequency, particularly at night.  No overt dysuria.  She reports she has a history of a "mass" that was removed on the left kidney at [facility].  She is not currently on any kind of chemotherapy, however.  She states she has had some occasional chills but no known fevers.  No nausea or vomiting.  No diarrhea or change in bowel habits.  She also notes some mild discomfort with intercourse on the left.  Of note, she was seen by her PCP 2 weeks ago for this and given a 10-day course of antibiotics in addition to testing for STDs, which were negative.  No specific alleviating factors.       Past Medical History:  Diagnosis Date  . Anxiety   . Panic attack     There are no problems to display for this patient.   Past Surgical History:  Procedure Laterality Date  . TUBAL LIGATION      Prior to Admission medications   Medication Sig Start Date End Date Taking? Authorizing Provider  azithromycin (ZITHROMAX Z-PAK) 250 MG tablet Take 2 tablets (500 mg) on  Day 1,  followed by 1 tablet (250 mg) once daily on Days 2 through 5. 10/05/15   Jennye Moccasin, MD  chlorpheniramine-HYDROcodone [facility] PENNKINETIC ER) 10-8 MG/5ML SUER Take 5 mLs by mouth at bedtime as needed for cough. 10/05/15   Jennye Moccasin, MD    citalopram (CELEXA) 20 MG tablet Take 20 mg by mouth daily.    [provider]  HYDROcodone-acetaminophen (NORCO/VICODIN) 5-325 MG tablet Take 1-2 tablets by mouth every 6 (six) hours as needed for moderate pain or severe pain. 06/26/19 06/25/20  Shaune Pollack, MD  ibuprofen (ADVIL) 600 MG tablet Take 1 tablet (600 mg total) by mouth 3 (three) times daily for 5 days. 06/26/19 07/01/19  Shaune Pollack, MD  phenazopyridine (PYRIDIUM) 200 MG tablet Take 1 tablet (200 mg total) by mouth 3 (three) times daily as needed for pain. 11/25/18   Joni Reining, PA-C  sulfamethoxazole-trimethoprim (BACTRIM DS) 800-160 MG tablet Take 1 tablet by mouth 2 (two) times daily. 11/25/18   Joni Reining, PA-C    Allergies Penicillins  No family history on file.  Social History Social History   Tobacco Use  . Smoking status: Never Smoker  . Smokeless tobacco: Never Used  Substance Use Topics  . Alcohol use: No  . Drug use: No    Review of Systems  Review of Systems  Constitutional: Positive for chills and fatigue. Negative for fever.  HENT: Negative for congestion and sore throat.   Eyes: Negative for visual disturbance.  Respiratory: Negative for cough and shortness of breath.   Cardiovascular: Negative for chest pain.  Gastrointestinal: Positive for abdominal pain. Negative for diarrhea, nausea and vomiting.  Genitourinary: Positive for dysuria, flank  pain and frequency.  Musculoskeletal: Negative for back pain and neck pain.  Skin: Negative for rash and wound.  Neurological: Negative for weakness.  All other systems reviewed and are negative.    ____________________________________________  PHYSICAL EXAM:      VITAL SIGNS: ED Triage Vitals  Enc Vitals Group     BP 06/26/19 0553 (!) 158/96     Pulse Rate 06/26/19 0553 81     Resp 06/26/19 0553 18     Temp 06/26/19 0553 98.3 F (36.8 C)     Temp Source 06/26/19 0553 Oral     SpO2 06/26/19 0553 100 %     Weight 06/26/19 0550  230 lb (104.3 kg)     Height 06/26/19 0550 5\' 6"  (1.676 m)     Head Circumference --      Peak Flow --      Pain Score 06/26/19 0625 6     Pain Loc --      Pain Edu? --      Excl. in [place]? --      Physical Exam Vitals and nursing note reviewed.  Constitutional:      General: She is not in acute distress.    Appearance: She is well-developed.  HENT:     Head: Normocephalic and atraumatic.  Eyes:     Conjunctiva/sclera: Conjunctivae normal.  Cardiovascular:     Rate and Rhythm: Normal rate and regular rhythm.     Heart sounds: Normal heart sounds. No murmur. No friction rub.  Pulmonary:     Effort: Pulmonary effort is normal. No respiratory distress.     Breath sounds: Normal breath sounds. No wheezing or rales.  Abdominal:     General: There is no distension.     Palpations: Abdomen is soft.     Tenderness: There is abdominal tenderness in the periumbilical area, left upper quadrant and left lower quadrant. There is left CVA tenderness. There is no guarding or rebound.  Musculoskeletal:     Cervical back: Neck supple.  Skin:    General: Skin is warm.     Capillary Refill: Capillary refill takes less than 2 seconds.  Neurological:     Mental Status: She is alert and oriented to person, place, and time.     Motor: No abnormal muscle tone.       ____________________________________________   LABS (all labs ordered are listed, but only abnormal results are displayed)  Labs Reviewed  COMPREHENSIVE METABOLIC PANEL - Abnormal; Notable for the following components:      Result Value   Potassium 3.2 (*)    Calcium 8.6 (*)    All other components within normal limits  CBC - Abnormal; Notable for the following components:   MCV 79.8 (*)    All other components within normal limits  URINALYSIS, COMPLETE (UACMP) WITH MICROSCOPIC - Abnormal; Notable for the following components:   Color, Urine STRAW (*)    APPearance CLOUDY (*)    Hgb urine dipstick MODERATE (*)     Leukocytes,Ua SMALL (*)    Bacteria, UA RARE (*)    All other components within normal limits  LIPASE, BLOOD  POC URINE PREG, ED  POCT PREGNANCY, URINE    ____________________________________________  EKG: None ________________________________________  RADIOLOGY All imaging, including plain films, CT scans, and ultrasounds, independently reviewed by me, and interpretations confirmed via formal radiology reads.  ED MD interpretation:   CT abdomen/pelvis: Neg, no acute abnormality  Official radiology report(s): CT ABDOMEN PELVIS W CONTRAST  Result Date: 06/26/2019 CLINICAL DATA:  Left lower quadrant and left flank pain. History of renal cell carcinoma treated with surgery EXAM: CT ABDOMEN AND PELVIS WITH CONTRAST TECHNIQUE: Multidetector CT imaging of the abdomen and pelvis was performed using the standard protocol following bolus administration of intravenous contrast. CONTRAST:  OMNIPAQUE IOHEXOL 300 MG/ML  SOLN COMPARISON:  Abdominal CT report from [facility] 10/06/2018 FINDINGS: Lower chest: Patchy mosaic attenuation in the lower lungs with appearance of air trapping. There is a 4 mm pulmonary nodule at the right middle lobe, stable from CT report. Calcified granuloma is in the left lower lobe. Hepatobiliary: No focal liver abnormality.No evidence of biliary obstruction or stone. Pancreas: Unremarkable. Spleen: Unremarkable. Adrenals/Urinary Tract: Negative adrenals. No hydronephrosis or stone. Left lower pole partial nephrectomy with fat necrosis appearance that is stable from outside CT description. Remaining parenchyma is symmetrically enhancing with no evidence of inflammation, mass, or urine leak. Simple right upper pole cyst measuring 16 mm. No hydronephrosis. Stomach/Bowel:  No obstruction. No evidence of bowel inflammation Vascular/Lymphatic: No acute vascular abnormality. No mass or adenopathy. Reproductive:IUD in expected position. Bilateral ovarian cystic densities measuring up to  2.7 cm on the left, age suggesting these are follicular. Other: No ascites or pneumoperitoneum. Musculoskeletal: No acute finding.  Spinal degeneration. IMPRESSION: 1. No acute finding or specific explanation for symptoms. 2. Unremarkable appearance of left partial nephrectomy site that correlates with description on outside abdominal CT report May 2020. 3. Patchy air trapping at the lung bases. Electronically Signed   By: Marnee Spring M.D.   On: 06/26/2019 08:00    ____________________________________________  PROCEDURES   Procedure(s) performed (including Critical Care):  Procedures  ____________________________________________  INITIAL IMPRESSION / MDM / ASSESSMENT AND PLAN / ED COURSE  As part of my medical decision making, I reviewed the following data within the electronic MEDICAL RECORD NUMBER Nursing notes reviewed and incorporated, Old chart reviewed, Notes from prior ED visits, and [place] Controlled Substance Database       *Sheila A Stefanick was evaluated in Emergency Department on 06/26/2019 for the symptoms described in the history of present illness. She was evaluated in the context of the global COVID-19 pandemic, which necessitated consideration that the patient might be at risk for infection with the SARS-CoV-2 virus that causes COVID-19. Institutional protocols and algorithms that pertain to the evaluation of patients at risk for COVID-19 are in a state of rapid change based on information released by regulatory bodies including the CDC and federal and state organizations. These policies and algorithms were followed during the patient's care in the ED.  Some ED evaluations and interventions may be delayed as a result of limited staffing during the pandemic.*  Clinical Course as of Jun 25 853  Mon Jun 26, 2019  7238 44 year old female here with left sided abdominal pain.  History of renal cell carcinoma on the left per review of records, as well as endometriosis.  She was recently  evaluated by her PCP and treated for possible UTI as well as STD/STI.  Differential is broad, includes ongoing UTI, pyelo, renal abscess, renal stone, recurrent renal cell CA, endometriosis, ovarian cyst. Pain is not c/w torsion. No STI/STD and she is s/p treatment, denies any vaginal bleeding or discharge - doubt PID, TOA.   [CI]  0759 WBC normal. UA does show mild pyuria, hematuria though +Squams raise question of contamination. CMP unremarkable with exception of mild hypokalemia.   [CI]  [id number] CT imaging is unremarkable. No evidence of renal abscess  or recurrent mass. Of note, the patient does have multiple ovarian cysts and per review of her OB records, carries a diagnosis of both endometriosis, chronic pelvic pain, as well as PCO S. I suspect many of her symptoms are related to this. She also has a questionable history of interstitial cystitis and her ongoing mild pyuria with recurrent treatment for UTIs and no improvement, is concerning for IC. Given that she just completed a full course of antibiotics for UTI, as well as recent testing and pelvic exam for STD/STI, do not feel ongoing antibiotics are indicated. I will add on a urine culture. Otherwise, will plan to treat with scheduled anti-inflammatories, brief course of analgesics, and follow-up. No cyst greater than 3 cm, no evidence of secondary pelvic changes, doubt torsion given otherwise reassuring findings despite symptoms greater than 1 month.   [CI]    Clinical Course User Index [CI] Shaune Pollack, MD    Medical Decision Making:  As above.  ____________________________________________  FINAL CLINICAL IMPRESSION(S) / ED DIAGNOSES  Final diagnoses:  Left lateral abdominal pain  PCOS (polycystic ovarian syndrome)     MEDICATIONS GIVEN DURING THIS VISIT:  Medications  potassium chloride SA (KLOR-CON) CR tablet 40 mEq (40 mEq Oral Given 06/26/19 0732)  ondansetron (ZOFRAN) injection 4 mg (4 mg Intravenous Given 06/26/19 0731)    ketorolac (TORADOL) 30 MG/ML injection 15 mg (15 mg Intravenous Given 06/26/19 0731)  sodium chloride 0.9 % bolus 1,000 mL (1,000 mLs Intravenous New Bag/Given 06/26/19 0732)  iohexol (OMNIPAQUE) 300 MG/ML solution 125 mL (125 mLs Intravenous Contrast Given 06/26/19 0742)     ED Discharge Orders         Ordered    ibuprofen (ADVIL) 600 MG tablet  3 times daily     06/26/19 0851    HYDROcodone-acetaminophen (NORCO/VICODIN) 5-325 MG tablet  Every 6 hours PRN     06/26/19 0851           Note:  This document was prepared using Dragon voice recognition software and may include unintentional dictation errors.   Shaune Pollack, MD 06/26/19 [phone]

## 2019-06-26 NOTE — ED Triage Notes (Signed)
Left sided abdominal pain with increased urination.  Increased urination for 1 month, abdominal pain for 1 week.

## 2019-08-11 ENCOUNTER — Emergency Department
Admission: EM | Admit: 2019-08-11 | Discharge: 2019-08-11 | Disposition: A | Discharge status: Home / Self Care | Payer: Self-pay | Attending: Emergency Medicine | Admitting: Emergency Medicine

## 2019-08-11 ENCOUNTER — Emergency Department: Payer: Self-pay

## 2019-08-11 ENCOUNTER — Other Ambulatory Visit: Payer: Self-pay

## 2019-08-11 DIAGNOSIS — G510 Bell's palsy: Secondary | ICD-10-CM | POA: Insufficient documentation

## 2019-08-11 DIAGNOSIS — Z79899 Other long term (current) drug therapy: Secondary | ICD-10-CM | POA: Insufficient documentation

## 2019-08-11 LAB — POCT PREGNANCY, URINE: Preg Test, Ur: NEGATIVE

## 2019-08-11 IMAGING — CT CT HEAD W/O CM
3 series · 16 of 47 positions shown, 19 images · non-contrast
Comparison: Head CT 01/01/2006

CLINICAL DATA: Headache for 3 days.

EXAM:
CT HEAD WITHOUT CONTRAST
TECHNIQUE: Contiguous axial images were obtained from the base of the skull
through the vertex without intravenous contrast.

[Series 2: head wo · axial · 0.39mm/px · z∈[-73,+57]mm · 10 of 32 slices shown, 13 images]
[im 3/32  brain]
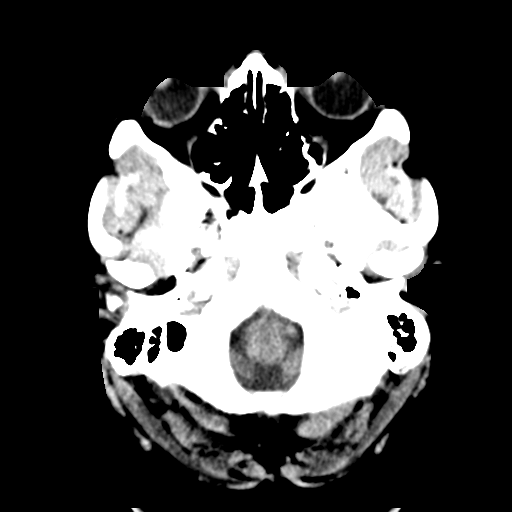
[im 3/32  bone]
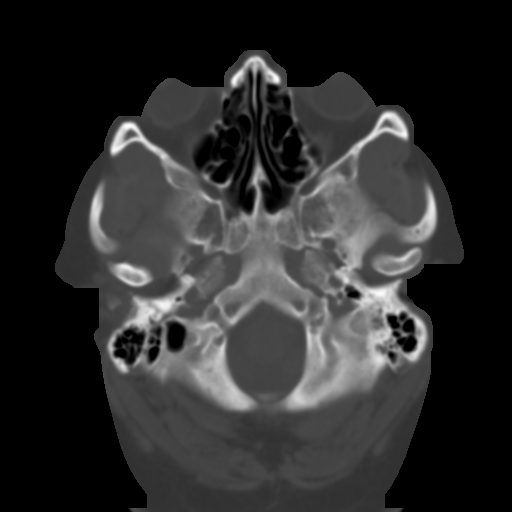
[im 6/32  brain]
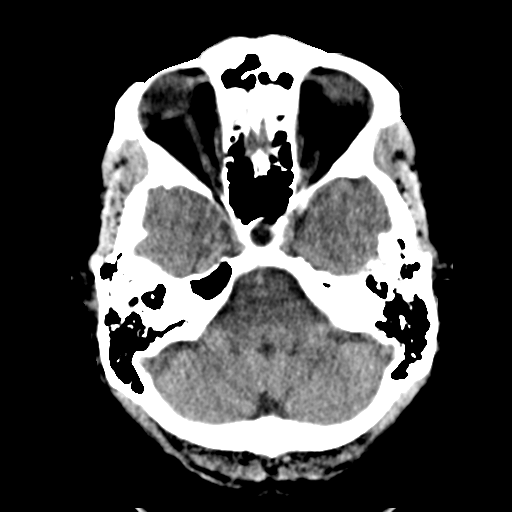
[im 9/32  brain]
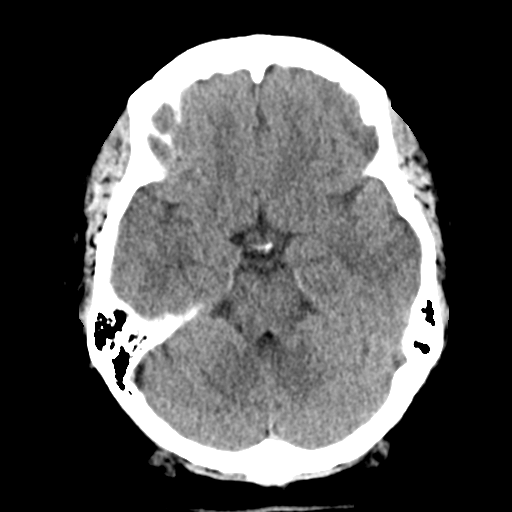
[im 11/32  brain]
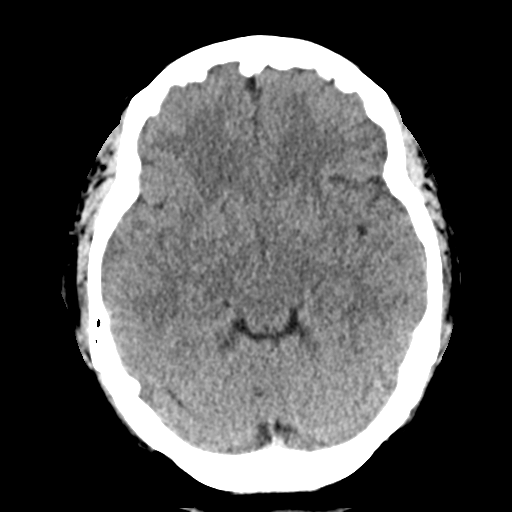
[im 14/32  brain]
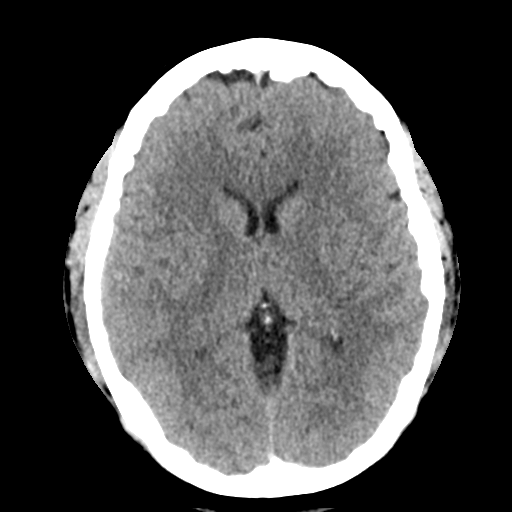
[im 14/32  bone]
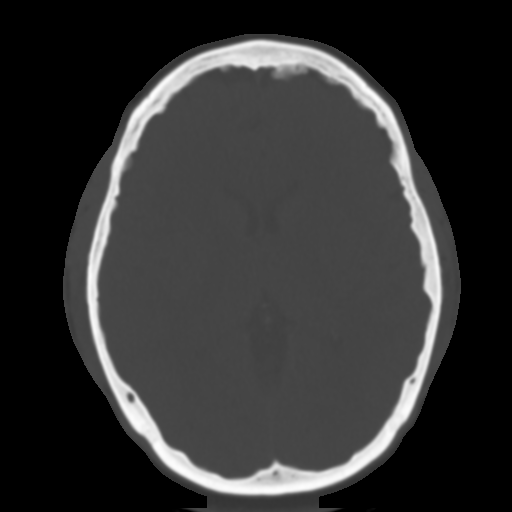
[im 18/32  brain]
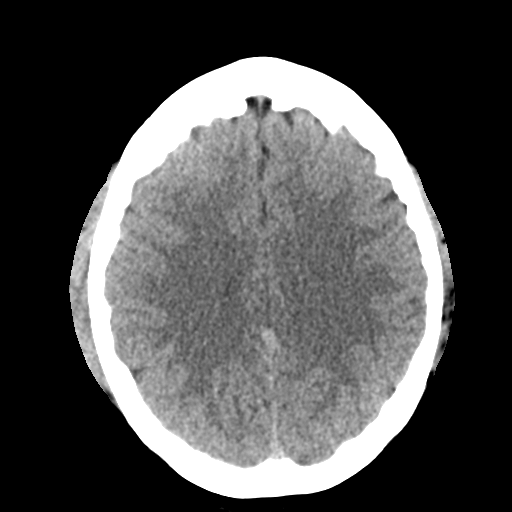
[im 21/32  brain]
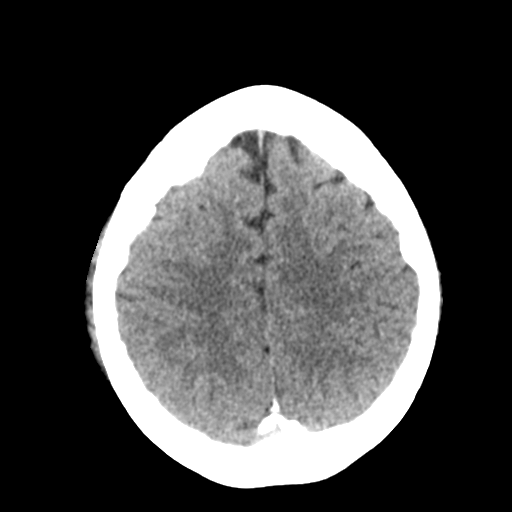
[im 24/32  brain]
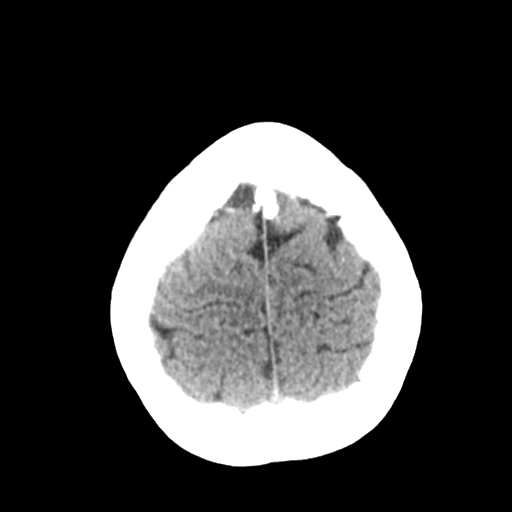
[im 26/32  brain]
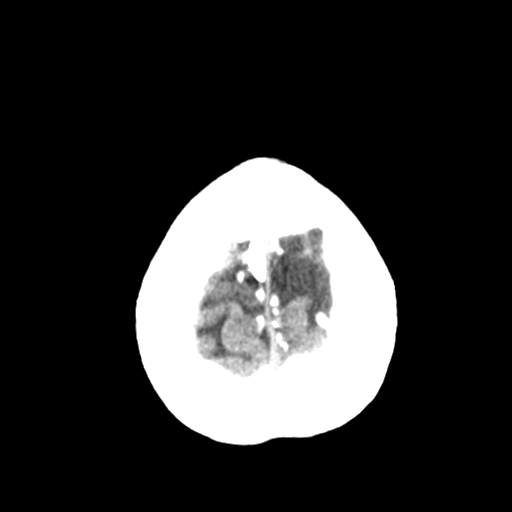
[im 26/32  bone]
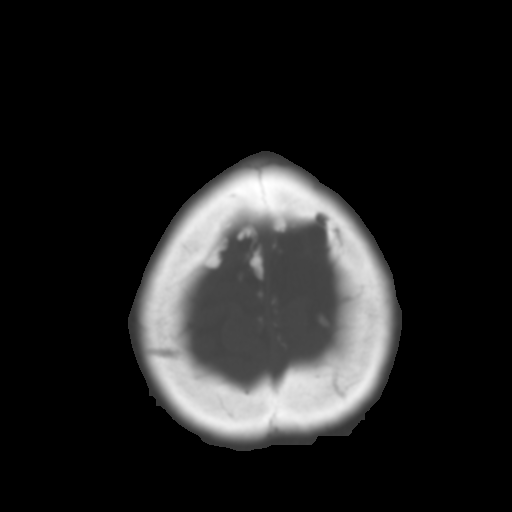
[im 29/32  brain]
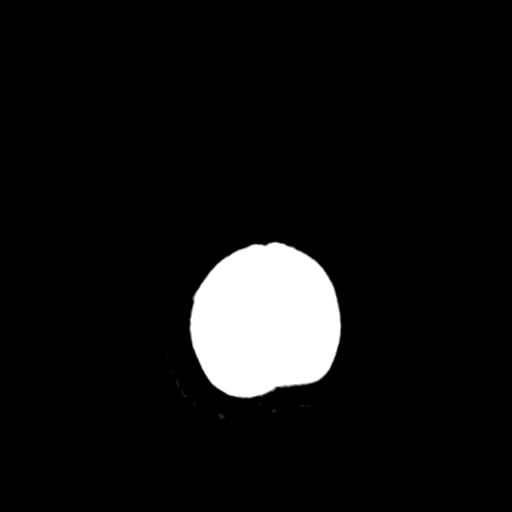

[Series 4: coronal soft tissue · coronal · 0.35mm/px · 3 of 68 slices shown]
[im 23/68  brain]
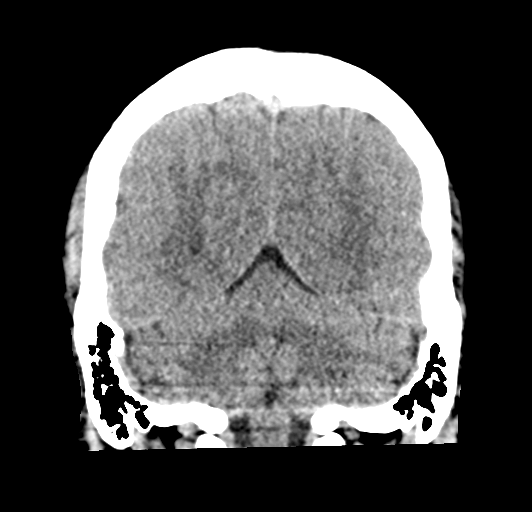
[im 30/68  brain]
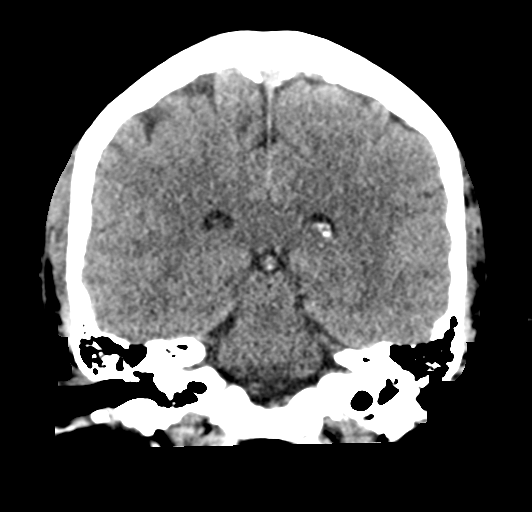
[im 38/68  brain]
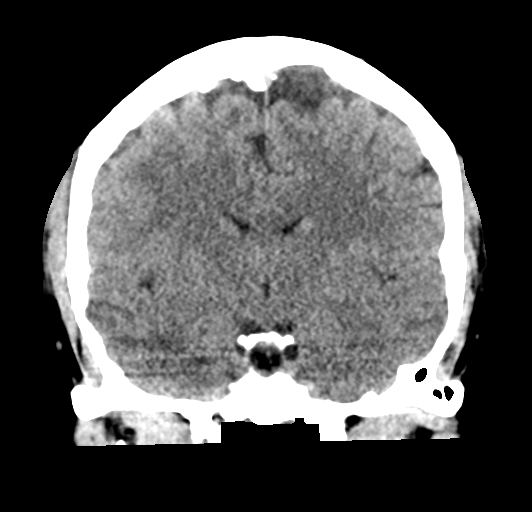

[Series 5: sagittal soft tissue · sagittal · 0.31mm/px · 3 of 55 slices shown]
[im 19/55  brain]
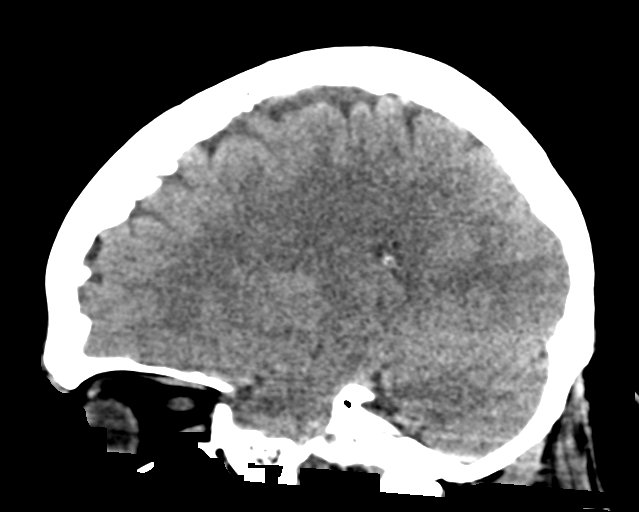
[im 28/55  brain]
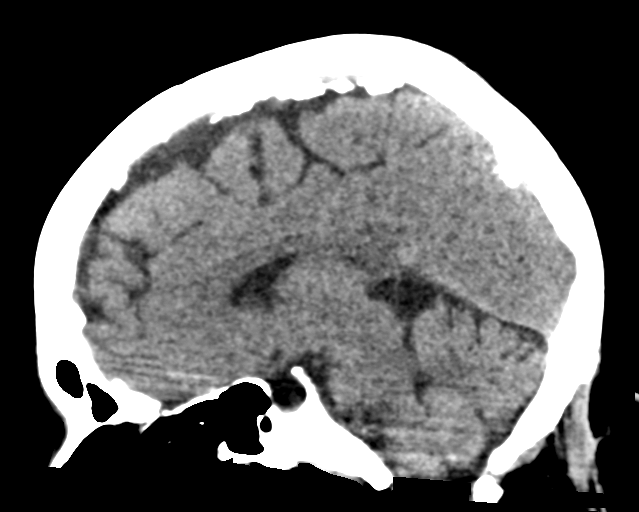
[im 37/55  brain]
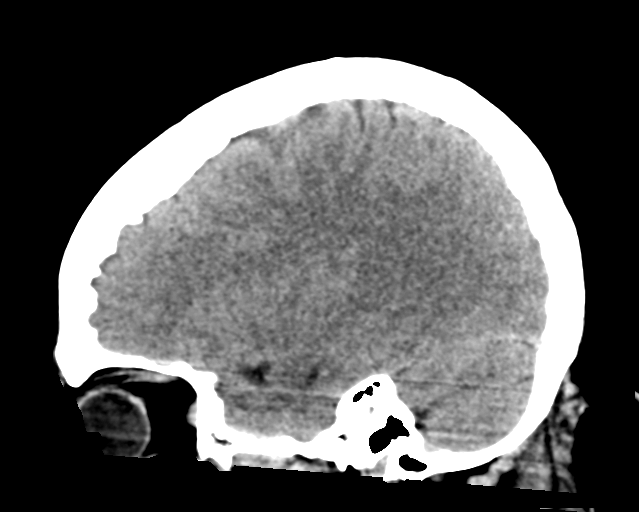

[16 of 47 positions shown; findings below may reference images not displayed]

FINDINGS: Brain: The ventricles are normal in size and configuration. No
extra-axial fluid collections are identified. The gray-white
differentiation is maintained. No CT findings for acute hemispheric
infarction or intracranial hemorrhage. No mass lesions. The
brainstem and cerebellum are normal.

Vascular: No hyperdense vessels or obvious aneurysm.

Skull: No acute skull fracture. No bone lesion. Stable hyperostosis
frontalis interna and scattered dural calcifications.

Sinuses/Orbits: The paranasal sinuses and mastoid air cells are
clear. The globes are intact.

Other: No scalp lesions, laceration or hematoma.
IMPRESSION: No acute intracranial findings or mass lesions.

## 2019-08-11 MED ORDER — ACYCLOVIR 400 MG PO TABS: 400.0000 mg | ORAL_TABLET | Freq: Every day | ORAL | 0 refills | Status: AC

## 2019-08-11 MED ORDER — VALACYCLOVIR HCL 500 MG PO TABS: 1000.0000 mg | ORAL_TABLET | Freq: Three times a day (TID) | ORAL | 0 refills | Status: DC

## 2019-08-11 MED ORDER — PREDNISONE 20 MG PO TABS: 60.0000 mg | ORAL_TABLET | Freq: Every day | ORAL | 0 refills | Status: AC

## 2019-08-11 MED ORDER — ACETAMINOPHEN 500 MG PO TABS
1000.0000 mg | ORAL_TABLET | Freq: Once | ORAL | Status: AC
  Administered 2019-08-11: 07:00:00 1000 mg via ORAL
  Filled 2019-08-11: qty 2

## 2019-08-11 MED ORDER — KETOROLAC TROMETHAMINE 30 MG/ML IJ SOLN
30.0000 mg | Freq: Once | INTRAMUSCULAR | Status: AC
  Administered 2019-08-11: 30 mg via INTRAMUSCULAR
  Filled 2019-08-11: qty 1

## 2019-08-11 NOTE — ED Notes (Signed)
Reviewed pt's c/o with Dr Scotty Court, no orders given at this time

## 2019-08-11 NOTE — Discharge Instructions (Addendum)
You have Bell's palsy.  You should get artificial eyedrops that are over-the-counter and use every 1 hour to help keep your eye moist and.  If the eyes starts not closing fully you should take the shot at nighttime to help protect it from getting an abrasion.  You should take the steroids and antivirals. The steroids are the mos important!!!   Sometimes this can take months to recover fully.  You can follow-up with your primary doctor.  Return to the ER if you develop weakness in your arms or legs or any other concerns.  You can take Tylenol 1 g every 8 hours and ibuprofen 600 every 6 hours with food.

## 2019-08-11 NOTE — ED Provider Notes (Signed)
[facility] Emergency Department Provider Note  ____________________________________________   First MD Initiated Contact with Patient 08/11/19 0701     (approximate)  I have reviewed the triage vital signs and the nursing notes.   HISTORY  Chief Complaint Headache    HPI JANYCE ELLINGER is a 44 y.o. female with anxiety who comes in for headache.  Patient states that she had a mild to moderate headache for the past 3 days.  States that it felt just like some tension in the back of her head.  However yesterday she developed some left-sided facial tingling and some left-sided tongue tingling.  She notes that she was not able to fully close her left arm and she is not able to move the left side of her face as well.  This is been constant, nothing makes it better, nothing makes it worse.  She denies sudden or severe onset of headache.  Denies any rash or tick bites.  She denies any fevers.  She states that her headache is only moderate at this time.  She denies any prior history of stroke.  No prior history of this happening before.  She denies any vision changes but just feels like her left eye is tearing because she cannot close it completely.          Past Medical History:  Diagnosis Date   Anxiety    Panic attack     There are no problems to display for this patient.   Past Surgical History:  Procedure Laterality Date   TUBAL LIGATION      Prior to Admission medications   Medication Sig Start Date End Date Taking? Authorizing Provider  azithromycin (ZITHROMAX Z-PAK) 250 MG tablet Take 2 tablets (500 mg) on  Day 1,  followed by 1 tablet (250 mg) once daily on Days 2 through 5. 10/05/15   Jennye Moccasin, MD  chlorpheniramine-HYDROcodone [facility] PENNKINETIC ER) 10-8 MG/5ML SUER Take 5 mLs by mouth at bedtime as needed for cough. 10/05/15   Jennye Moccasin, MD  citalopram (CELEXA) 20 MG tablet Take 20 mg by mouth daily.    [provider]    HYDROcodone-acetaminophen (NORCO/VICODIN) 5-325 MG tablet Take 1-2 tablets by mouth every 6 (six) hours as needed for moderate pain or severe pain. 06/26/19 06/25/20  Shaune Pollack, MD  phenazopyridine (PYRIDIUM) 200 MG tablet Take 1 tablet (200 mg total) by mouth 3 (three) times daily as needed for pain. 11/25/18   Joni Reining, PA-C  sulfamethoxazole-trimethoprim (BACTRIM DS) 800-160 MG tablet Take 1 tablet by mouth 2 (two) times daily. 11/25/18   Joni Reining, PA-C    Allergies Penicillins  No family history on file.  Social History Social History   Tobacco Use   Smoking status: Never Smoker   Smokeless tobacco: Never Used  Substance Use Topics   Alcohol use: No   Drug use: No      Review of Systems Constitutional: No fever/chills Eyes: No visual changes. ENT: No sore throat. Cardiovascular: Denies chest pain. Respiratory: Denies shortness of breath. Gastrointestinal: No abdominal pain.  No nausea, no vomiting.  No diarrhea.  No constipation. Genitourinary: Negative for dysuria. Musculoskeletal: Negative for back pain. Skin: Negative for rash. Neurological: Positive headache and left-sided facial weakness All other ROS negative ____________________________________________   PHYSICAL EXAM:  VITAL SIGNS: ED Triage Vitals [08/11/19 0447]  Enc Vitals Group     BP (!) 145/103     Pulse Rate 78  Resp 20     Temp 97.7 F (36.5 C)     Temp Source Oral     SpO2 99 %     Weight 230 lb (104.3 kg)     Height 5\' 6"  (1.676 m)     Head Circumference      Peak Flow      Pain Score 6     Pain Loc      Pain Edu?      Excl. in [place]?     Constitutional: Alert and oriented. Well appearing and in no acute distress. Eyes: Conjunctivae are normal. EOMI. Head: Atraumatic. Nose: No congestion/rhinnorhea. Mouth/Throat: Mucous membranes are moist.  TM clear bilaterally. Neck: No stridor. Trachea Midline. FROM Cardiovascular: Normal rate, regular rhythm. Grossly  normal heart sounds.  Good peripheral circulation. Respiratory: Normal respiratory effort.  No retractions. Lungs CTAB. Gastrointestinal: Soft and nontender. No distention. No abdominal bruits.  Musculoskeletal: No lower extremity tenderness nor edema.  No joint effusions. Neurologic: Patient with normal speech, facial droop on the left with sensation changes on the left.  Patient is unable to wrinkle the left side of her forehead.  Equal strength in her arms and legs.  Sensation intact.  Finger-to-nose intact. Ambulates without ataxia Skin:  Skin is warm, dry and intact. No rash noted. Psychiatric: Mood and affect are normal. Speech and behavior are normal. GU: Deferred   ____________________________________________   LABS (all labs ordered are listed, but only abnormal results are displayed)  Labs Reviewed - No data to display ____________________________________________   RADIOLOGY  Official radiology report(s): CT Head Wo Contrast  Result Date: 08/11/2019 CLINICAL DATA:  Headache for 3 days. EXAM: CT HEAD WITHOUT CONTRAST TECHNIQUE: Contiguous axial images were obtained from the base of the skull through the vertex without intravenous contrast. COMPARISON:  Head CT 01/01/2006 FINDINGS: Brain: The ventricles are normal in size and configuration. No extra-axial fluid collections are identified. The gray-white differentiation is maintained. No CT findings for acute hemispheric infarction or intracranial hemorrhage. No mass lesions. The brainstem and cerebellum are normal. Vascular: No hyperdense vessels or obvious aneurysm. Skull: No acute skull fracture. No bone lesion. Stable hyperostosis frontalis interna and scattered dural calcifications. Sinuses/Orbits: The paranasal sinuses and mastoid air cells are clear. The globes are intact. Other: No scalp lesions, laceration or hematoma. IMPRESSION: No acute intracranial findings or mass lesions. Electronically Signed   By: Marijo Sanes M.D.    On: 08/11/2019 07:38    ____________________________________________   PROCEDURES  Procedure(s) performed (including Critical Care):  Procedures   ____________________________________________   INITIAL IMPRESSION / ASSESSMENT AND PLAN / ED COURSE  Deshea A Norrod was evaluated in Emergency Department on 08/11/2019 for the symptoms described in the history of present illness. She was evaluated in the context of the global COVID-19 pandemic, which necessitated consideration that the patient might be at risk for infection with the SARS-CoV-2 virus that causes COVID-19. Institutional protocols and algorithms that pertain to the evaluation of patients at risk for COVID-19 are in a state of rapid change based on information released by regulatory bodies including the CDC and federal and state organizations. These policies and algorithms were followed during the patient's care in the ED.    Patient is a 44 year old well-appearing female who comes in with headache for 3 days associated with cranial nerve VII involvement.  It is not bilateral in nature and patient denies any tick bites to suggest Lyme disease.  I have low suspicion for stroke given otherwise  normal neuro exam and the fact that her forehead is not spared.  However given patient's age, preceding headache and hypertension will get a CT scan just to make sure is no evidence of mass, intracranial hemorrhage.  Have low suspicion for subarachnoid given was not sudden or severe in onset.  No evidence of Ramsay Hunt on examination.  Dw pt she is driving home so we will give Tylenol and Toradol to help with discomfort.  We discussed antiviral therapy and patient does not have insurance and would prefer to use acyclovir due to cheaper cost.  We discussed cornea eye protection with artificial tears over-the-counter and keeping the eye shut at nighttime.  We discussed following up with ophthalmology to monitor the cornea.  And that this can take weeks  to months to resolve fully and if is not fully resolving she can follow-up with her primary doctor.  I discussed the provisional nature of ED diagnosis, the treatment so far, the ongoing plan of care, follow up appointments and return precautions with the patient and any family or support people present. They expressed understanding and agreed with the plan, discharged home.  ____________________________________________   FINAL CLINICAL IMPRESSION(S) / ED DIAGNOSES   Final diagnoses:  Bell's palsy      MEDICATIONS GIVEN DURING THIS VISIT:  Medications  ketorolac (TORADOL) 30 MG/ML injection 30 mg (has no administration in time range)  acetaminophen (TYLENOL) tablet 1,000 mg (1,000 mg Oral Given 08/11/19 0716)     ED Discharge Orders         Ordered    predniSONE (DELTASONE) 20 MG tablet  Daily     08/11/19 0722    valACYclovir (VALTREX) 500 MG tablet  3 times daily,   Status:  Discontinued     08/11/19 0722    acyclovir (ZOVIRAX) 400 MG tablet  5 times daily     08/11/19 0755           Note:  This document was prepared using Dragon voice recognition software and may include unintentional dictation errors.   Concha Se, MD 08/11/19 [phone]

## 2019-08-11 NOTE — ED Triage Notes (Signed)
Pt in with co headache x 3 days then noted let sided facial numbness yesterday. Denies any deficits to extremities, pt does have difficulty closing left eye.

## 2019-08-11 NOTE — ED Notes (Signed)
Transported to [place] 

## 2019-08-21 ENCOUNTER — Other Ambulatory Visit: Payer: Self-pay

## 2019-08-21 ENCOUNTER — Emergency Department
Admission: EM | Admit: 2019-08-21 | Discharge: 2019-08-21 | Disposition: A | Discharge status: Home / Self Care | Payer: Self-pay | Attending: Emergency Medicine | Admitting: Emergency Medicine

## 2019-08-21 DIAGNOSIS — H5712 Ocular pain, left eye: Secondary | ICD-10-CM | POA: Insufficient documentation

## 2019-08-21 DIAGNOSIS — H9202 Otalgia, left ear: Secondary | ICD-10-CM

## 2019-08-21 DIAGNOSIS — Z79899 Other long term (current) drug therapy: Secondary | ICD-10-CM | POA: Insufficient documentation

## 2019-08-21 DIAGNOSIS — G510 Bell's palsy: Secondary | ICD-10-CM | POA: Insufficient documentation

## 2019-08-21 MED ORDER — FLUORESCEIN SODIUM 1 MG OP STRP
1.0000 | ORAL_STRIP | Freq: Once | OPHTHALMIC | Status: AC
  Administered 2019-08-21: 06:00:00 1 via OPHTHALMIC

## 2019-08-21 MED ORDER — OXYCODONE-ACETAMINOPHEN 5-325 MG PO TABS
2.0000 | ORAL_TABLET | Freq: Once | ORAL | Status: AC
  Administered 2019-08-21: 2 via ORAL
  Filled 2019-08-21: qty 2

## 2019-08-21 MED ORDER — TETRACAINE HCL 0.5 % OP SOLN
1.0000 [drp] | Freq: Once | OPHTHALMIC | Status: AC
  Administered 2019-08-21: 1 [drp] via OPHTHALMIC

## 2019-08-21 MED ORDER — OXYCODONE-ACETAMINOPHEN 5-325 MG PO TABS: 2.0000 | ORAL_TABLET | Freq: Three times a day (TID) | ORAL | 0 refills | Status: DC | PRN

## 2019-08-21 NOTE — Discharge Instructions (Signed)
As we discussed, your physical exam is reassuring today.  There is no sign of infection in your left ear and I think that the pain you are having is related to your Bell's palsy.  Please do not put anything in your ear, including no foreign objects and no liquids.  Take over-the-counter ibuprofen and Tylenol as needed for pain. Take Percocet as prescribed for severe pain. Do not drink alcohol, drive or participate in any other potentially dangerous activities while taking this medication as it may make you sleepy. Do not take this medication with any other sedating medications, either prescription or over-the-counter. If you were prescribed Percocet or Vicodin, do not take these with acetaminophen (Tylenol) as it is already contained within these medications.   This medication is an opiate (or narcotic) pain medication and can be habit forming.  Use it as little as possible to achieve adequate pain control.  Do not use or use it with extreme caution if you have a history of opiate abuse or dependence.  If you are on a pain contract with your primary care doctor or a pain specialist, be sure to let them know you were prescribed this medication today from the [facility] Emergency Department.  This medication is intended for your use only - do not give any to anyone else and keep it in a secure place where nobody else, especially children, have access to it.  It will also cause or worsen constipation, so you may want to consider taking an over-the-counter stool softener while you are taking this medication.  Additionally, you should consider getting a nighttime ophthalmic lubricating ointment.  There is over-the-counter generic versions and [employer] makes it as well, the same company that makes the drops you are using.  You can put this ointment in your eye before you go to sleep and it will help keep your eye moist even though it is not completely closed.  I also recommend you pad the outside of your eye  with gauze and consider taping it shut at night to make sure that the eyeball is protected.

## 2019-08-21 NOTE — ED Provider Notes (Signed)
[facility] Emergency Department Provider Note  ____________________________________________   First MD Initiated Contact with Patient 08/21/19 0507     (approximate)  I have reviewed the triage vital signs and the nursing notes.   HISTORY  Chief Complaint Otalgia    HPI Rhonda Barrett is a 44 y.o. female recently seen in the emergency department and diagnosed with left-sided Bell's palsy who presents for evaluation of acute onset left ear pain over the last 24 hours as well as some left eye pain and irritation.  She says that she completed the course of acyclovir and prednisone she was prescribed.  She still has the left-sided facial droop and inability to close her left eye that she had when she was diagnosed with Bell's palsy.  She followed up with her regular doctor who prescribed ibuprofen for pain.  Then she developed the left ear pain without any history of trauma or putting anything in her ear.  She has had no drainage from the left ear.  She has no rash of which she is aware.  She also has some pain radiating throughout the left side of her face from the ear through the face and to her eye.  No fever, no difficulty swallowing, no other focal numbness nor weakness.         Past Medical History:  Diagnosis Date  . Anxiety   . Panic attack     There are no problems to display for this patient.   Past Surgical History:  Procedure Laterality Date  . TUBAL LIGATION      Prior to Admission medications   Medication Sig Start Date End Date Taking? Authorizing Provider  acyclovir (ZOVIRAX) 400 MG tablet Take 1 tablet (400 mg total) by mouth 5 (five) times daily for 10 days. 08/11/19 08/21/19  Concha Se, MD  azithromycin (ZITHROMAX Z-PAK) 250 MG tablet Take 2 tablets (500 mg) on  Day 1,  followed by 1 tablet (250 mg) once daily on Days 2 through 5. 10/05/15   Jennye Moccasin, MD  chlorpheniramine-HYDROcodone [facility] PENNKINETIC ER) 10-8 MG/5ML SUER  Take 5 mLs by mouth at bedtime as needed for cough. 10/05/15   Jennye Moccasin, MD  citalopram (CELEXA) 20 MG tablet Take 20 mg by mouth daily.    [provider]  HYDROcodone-acetaminophen (NORCO/VICODIN) 5-325 MG tablet Take 1-2 tablets by mouth every 6 (six) hours as needed for moderate pain or severe pain. 06/26/19 06/25/20  Shaune Pollack, MD  oxyCODONE-acetaminophen (PERCOCET) 5-325 MG tablet Take 2 tablets by mouth every 8 (eight) hours as needed for severe pain. 08/21/19   Loleta Rose, MD  phenazopyridine (PYRIDIUM) 200 MG tablet Take 1 tablet (200 mg total) by mouth 3 (three) times daily as needed for pain. 11/25/18   Joni Reining, PA-C  sulfamethoxazole-trimethoprim (BACTRIM DS) 800-160 MG tablet Take 1 tablet by mouth 2 (two) times daily. 11/25/18   Joni Reining, PA-C    Allergies Penicillins  No family history on file.  Social History Social History   Tobacco Use  . Smoking status: Never Smoker  . Smokeless tobacco: Never Used  Substance Use Topics  . Alcohol use: No  . Drug use: No    Review of Systems Constitutional: No fever/chills Eyes: Left eye irritation and occasional clear drainage. ENT: Acute onset left ear pain. Cardiovascular: Denies chest pain. Respiratory: Denies shortness of breath. Gastrointestinal: No abdominal pain.  No nausea, no vomiting.   Integumentary: Negative for rash. Neurological:  Persistent left-sided facial droop from recent Bell's palsy diagnosis.   ____________________________________________   PHYSICAL EXAM:  VITAL SIGNS: ED Triage Vitals  Enc Vitals Group     BP 08/21/19 0202 (!) 169/94     Pulse Rate 08/21/19 0202 81     Resp 08/21/19 0201 16     Temp 08/21/19 0201 98.3 F (36.8 C)     Temp Source 08/21/19 0201 Oral     SpO2 08/21/19 0204 100 %     Weight 08/21/19 0201 104.3 kg (230 lb)     Height 08/21/19 0201 1.676 m (5\' 6" )     Head Circumference --      Peak Flow --      Pain Score 08/21/19 0201 10      Pain Loc --      Pain Edu? --      Excl. in [place]? --     Constitutional: Alert and oriented.  Eyes: Conjunctivae are normal.  Pupils are equal and reactive.  No chemosis or periorbital edema.  No exophthalmos.  After tetracaine administration the patient's left eye pain improved.  With fluorescein and under [place] lamp there is no evidence of corneal abrasion, dendritic pattern, foreign body, etc.  No purulent discharge. Head: Atraumatic. Ears: No right ear complaints.  Left external ear is normal and nontender and there is no tenderness to palpation of the mastoid process.  No surrounding edema or cellulitis.  The ear canal is clean and free of debris with no erythema, no vesicular lesions, and the patient has a normal-appearing tympanic membrane. Nose: No congestion/rhinnorhea. Mouth/Throat: Patient is wearing a mask. Neck: No stridor.  No meningeal signs.   Cardiovascular: Normal rate, regular rhythm. Good peripheral circulation. Respiratory: Normal respiratory effort.  No retractions. Musculoskeletal: No lower extremity tenderness nor edema. No gross deformities of extremities. Neurologic:  Normal speech and language.  The patient has persistent left-sided facial droop which is very evident both in her eye and the left side of her mouth.  Symptoms are reportedly unchanged from when she was originally seen.  She cannot completely close her eye even when she tries to do so. Skin:  Skin is warm, dry and intact. Psychiatric: Mood and affect are normal. Speech and behavior are normal.  ____________________________________________   LABS (all labs ordered are listed, but only abnormal results are displayed)  Labs Reviewed - No data to display ____________________________________________  EKG  No indication for EKG. ____________________________________________  RADIOLOGY Ursula Alert, personally viewed and evaluated these images (plain radiographs) as part of my medical decision making,  as well as reviewing the written report by the radiologist.  ED MD interpretation: No indication for emergent imaging  Official radiology report(s): No results found.  ____________________________________________   PROCEDURES   Procedure(s) performed (including Critical Care):  Procedures   ____________________________________________   INITIAL IMPRESSION / MDM / [place] / ED COURSE  As part of my medical decision making, I reviewed the following data within the [place] notes reviewed and incorporated, Old chart reviewed, Notes from prior ED visits and [place] Controlled Substance Database   Differential diagnosis includes but is not limited to pain from her Bell's palsy, Ramsay Hunt syndrome, mastoiditis, otitis media, otitis externa.  Physical exam is reassuring.  Other than the persistent Bell's palsy symptoms, the patient has no other acute findings and including having a very well-appearing TM and ear canal.  There are no vesicular lesions or other rash.  Similarly her  left eye exam is reassuring and there are no corneal abrasions that could have been caused by her inability to completely close her eye.  She has been using [employer] lubricating drops but I encouraged her to obtain a nighttime lubricant ointment and also consider taping her eyes shut after protecting with some gauze.  I gave her 2 Percocet in the ED and provided prescription for a few Percocet but encouraged her to follow-up with either her primary care doctor or the ENT specialist for further evaluation.  Vital signs are normal and there is no indication for further work-up at this time including advanced imaging.  I gave my usual and customary return precautions.          ____________________________________________  FINAL CLINICAL IMPRESSION(S) / ED DIAGNOSES  Final diagnoses:  Otalgia of left ear  Left eye pain  Left-sided Bell's palsy     MEDICATIONS GIVEN  DURING THIS VISIT:  Medications  oxyCODONE-acetaminophen (PERCOCET/ROXICET) 5-325 MG per tablet 2 tablet (2 tablets Oral Given 08/21/19 0526)  tetracaine (PONTOCAINE) 0.5 % ophthalmic solution 1 drop (1 drop Left Eye Given by Other 08/21/19 0532)  fluorescein ophthalmic strip 1 strip (1 strip Left Eye Given by Other 08/21/19 0531)     ED Discharge Orders         Ordered    oxyCODONE-acetaminophen (PERCOCET) 5-325 MG tablet  Every 8 hours PRN     08/21/19 0532          *Please note:  Myra Rude was evaluated in Emergency Department on 08/21/2019 for the symptoms described in the history of present illness. She was evaluated in the context of the global COVID-19 pandemic, which necessitated consideration that the patient might be at risk for infection with the SARS-CoV-2 virus that causes COVID-19. Institutional protocols and algorithms that pertain to the evaluation of patients at risk for COVID-19 are in a state of rapid change based on information released by regulatory bodies including the CDC and federal and state organizations. These policies and algorithms were followed during the patient's care in the ED.  Some ED evaluations and interventions may be delayed as a result of limited staffing during the pandemic.*  Note:  This document was prepared using Dragon voice recognition software and may include unintentional dictation errors.   Loleta Rose, MD 08/21/19 [phone]

## 2019-08-21 NOTE — ED Triage Notes (Signed)
Pt with left sided ear pain today. Pt states she his also having drainage from left eye. Pt has cotton currently in left ear canal. Pt denies drainage from left ear.

## 2019-09-27 ENCOUNTER — Encounter: Payer: Self-pay | Admitting: Family Medicine

## 2019-09-27 ENCOUNTER — Ambulatory Visit: Payer: Self-pay | Admitting: Family Medicine

## 2019-09-27 ENCOUNTER — Other Ambulatory Visit: Payer: Self-pay

## 2019-09-27 DIAGNOSIS — N760 Acute vaginitis: Secondary | ICD-10-CM

## 2019-09-27 DIAGNOSIS — B9689 Other specified bacterial agents as the cause of diseases classified elsewhere: Secondary | ICD-10-CM

## 2019-09-27 DIAGNOSIS — Z113 Encounter for screening for infections with a predominantly sexual mode of transmission: Secondary | ICD-10-CM

## 2019-09-27 LAB — WET PREP FOR TRICH, YEAST, CLUE
Trichomonas Exam: NEGATIVE
Yeast Exam: NEGATIVE

## 2019-09-27 MED ORDER — METRONIDAZOLE 500 MG PO TABS: 500.0000 mg | ORAL_TABLET | Freq: Two times a day (BID) | ORAL | 0 refills | Status: AC

## 2019-09-27 NOTE — Progress Notes (Signed)
[facility] Department STI clinic/screening visit  Subjective:  Rhonda Barrett is a 44 y.o. female being seen today for  Chief Complaint  Patient presents with  . SEXUALLY TRANSMITTED DISEASE     The patient reports they do have symptoms. Patient reports that they do not desire a pregnancy in the next year. They reported they are not interested in discussing contraception today.   Patient has the following medical conditions:   Patient Active Problem List   Diagnosis Date Noted  . Dysuria 01/26/2019  . Intrauterine device surveillance 10/20/2018  . 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 Renal mass 09/23/2015    HPI  Pt reports she has had vaginal itching and lower abd pain x1 wk. 2 wks ago she also noticed a bump on her labia, nontender. She has hx of recurrent dysuria and urinary frequency, most recent episode 3 wks ago, saw PCP for this and is on symptomatic medication.  See flowsheet for further details and programmatic requirements.    Patient's last menstrual period was 09/14/2019 (within days). Last sex: 1 month ago BCM: BTL Desires EC? n/a   No components found for: HCV  The following portions of the patient's history were reviewed and updated as appropriate: allergies, current medications, past medical history, past social history, past surgical history and problem list.  Objective:  There were no vitals filed for this visit.   Physical Exam Vitals and nursing note reviewed.  Constitutional:      Appearance: Normal appearance.  HENT:     Head: Normocephalic and atraumatic.     Mouth/Throat:     Mouth: Mucous membranes are moist.     Pharynx: Oropharynx is clear. No oropharyngeal exudate or posterior oropharyngeal erythema.  Pulmonary:     Effort: Pulmonary effort is normal.  Abdominal:     General: Abdomen is flat.     Palpations: There is no mass.     Tenderness: There is no abdominal tenderness. There is no rebound.    Genitourinary:    General: Normal vulva.     Exam position: Lithotomy position.     Pubic Area: No rash or pubic lice.      Labia:        Right: No rash or lesion.        Left: No rash or lesion.      Vagina: Vaginal discharge (white, ph>4.5) present. No erythema, bleeding or lesions.     Cervix: No cervical motion tenderness, discharge, friability, lesion or erythema.     Uterus: Normal.      Adnexa: Right adnexa normal and left adnexa normal.     Rectum: Normal.    Lymphadenopathy:     Head:     Right side of head: No preauricular or posterior auricular adenopathy.     Left side of head: No preauricular or posterior auricular adenopathy.     Cervical: No cervical adenopathy.     Upper Body:     Right upper body: No supraclavicular or axillary adenopathy.     Left upper body: No supraclavicular or axillary adenopathy.     Lower Body: No right inguinal adenopathy. No left inguinal adenopathy.  Skin:    General: Skin is warm and dry.     Findings: No rash.  Neurological:     Mental Status: She is alert and oriented to person, place, and time.      Assessment and Plan:  [name] is a 44 y.o. female presenting to  the [facility] Department for STI screening    1. Screening examination for venereal disease -Pt with symptoms. Screenings today as below. Treat wet prep per standing order. 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 -Patient does not meet criteria for HepB, HepC Screening.  -Counseled on warning s/sx and when to seek care. Recommended condom use with all sex and discussed importance of condom use for STI prevention. - WET PREP FOR TRICH, YEAST, CLUE - Chlamydia/Gonorrhea [place] [facility] - HIV [place] [facility] - Syphilis Serology,  Lab     Return if symptoms worsen or fail to improve.  No future appointments.  Ann Held, PA-C

## 2019-09-27 NOTE — Progress Notes (Signed)
Wet mount reviewed and treated for BV per standing order. Jossie Ng, RN

## 2019-10-25 ENCOUNTER — Telehealth: Payer: Self-pay | Admitting: Family Medicine

## 2019-10-25 NOTE — Telephone Encounter (Signed)
Returned patient phone call to provided number. Patient requesting TR from 09/27/19 OV. Correct password given for chart. All TR from 09/27/19 OV verbally given to patient. Denies further questions at this time. Tawny Hopping, RN

## 2019-10-25 NOTE — Telephone Encounter (Signed)
WANTS TEST RESULTS 

## 2023-01-25 ENCOUNTER — Other Ambulatory Visit: Payer: Self-pay

## 2023-01-25 ENCOUNTER — Encounter: Payer: Self-pay | Admitting: *Deleted

## 2023-01-25 ENCOUNTER — Emergency Department
Admission: EM | Admit: 2023-01-25 | Discharge: 2023-01-25 | Disposition: A | Discharge status: Home / Self Care | Payer: Medicaid Other | Attending: Emergency Medicine | Admitting: Emergency Medicine

## 2023-01-25 DIAGNOSIS — K029 Dental caries, unspecified: Secondary | ICD-10-CM | POA: Insufficient documentation

## 2023-01-25 DIAGNOSIS — K047 Periapical abscess without sinus: Secondary | ICD-10-CM | POA: Insufficient documentation

## 2023-01-25 DIAGNOSIS — K0889 Other specified disorders of teeth and supporting structures: Secondary | ICD-10-CM

## 2023-01-25 MED ORDER — KETOROLAC TROMETHAMINE 30 MG/ML IJ SOLN
30.0000 mg | Freq: Once | INTRAMUSCULAR | Status: AC
  Administered 2023-01-25: 30 mg via INTRAMUSCULAR
  Filled 2023-01-25: qty 1

## 2023-01-25 MED ORDER — CLINDAMYCIN HCL 150 MG PO CAPS: 450.0000 mg | ORAL_CAPSULE | Freq: Three times a day (TID) | ORAL | 0 refills | Status: AC

## 2023-01-25 MED ORDER — OXYCODONE HCL 5 MG PO TABS
5.0000 mg | ORAL_TABLET | Freq: Once | ORAL | Status: AC
  Administered 2023-01-25: 5 mg via ORAL
  Filled 2023-01-25: qty 1

## 2023-01-25 MED ORDER — CLINDAMYCIN HCL 150 MG PO CAPS
450.0000 mg | ORAL_CAPSULE | Freq: Once | ORAL | Status: AC
  Administered 2023-01-25: 450 mg via ORAL
  Filled 2023-01-25: qty 3

## 2023-01-25 MED ORDER — AMOXICILLIN 500 MG PO CAPS: 500.0000 mg | ORAL_CAPSULE | Freq: Once | ORAL | Status: DC

## 2023-01-25 NOTE — ED Provider Notes (Signed)
[facility] Provider Note    Event Date/Time   First MD Initiated Contact with Patient 01/25/23 0104     (approximate)   History   Dental Pain   HPI  KRYSIA DEEGAN is a 47 y.o. female who presents to the ED for evaluation of Dental Pain   Patient presents for acute on chronic left mandibular dental pain.  Worsening pain over the past 3 days, reports sensation of fevers at night and localized swelling.  No difficulty breathing or swallowing.   Has extraction coming up next week   Physical Exam   Triage Vital Signs: ED Triage Vitals  Encounter Vitals Group     BP 01/25/23 0049 (!) 165/95     Systolic BP Percentile --      Diastolic BP Percentile --      Pulse Rate 01/25/23 0049 90     Resp 01/25/23 0049 18     Temp 01/25/23 0049 98.4 F (36.9 C)     Temp Source 01/25/23 0049 Oral     SpO2 01/25/23 0049 99 %     Weight --      Height --      Head Circumference --      Peak Flow --      Pain Score 01/25/23 0048 10     Pain Loc --      Pain Education --      Exclude from Growth Chart --     Most recent vital signs: Vitals:   01/25/23 0049  BP: (!) 165/95  Pulse: 90  Resp: 18  Temp: 98.4 F (36.9 C)  SpO2: 99%    General: Awake, no distress.  CV:  Good peripheral perfusion.  Resp:  Normal effort.  Abd:  No distention.  MSK:  No deformity noted.  Neuro:  No focal deficits appreciated. Other:  Carious dentition without signs of abscess or upper airway obstruction.   ED Results / Procedures / Treatments   Labs (all labs ordered are listed, but only abnormal results are displayed) Labs Reviewed - No data to display  EKG   RADIOLOGY   Official radiology report(s): No results found.  PROCEDURES and INTERVENTIONS:  Procedures  Medications  ketorolac (TORADOL) 30 MG/ML injection 30 mg (has no administration in time range)  oxyCODONE (Oxy IR/ROXICODONE) immediate release tablet 5 mg (has no administration in time range)   clindamycin (CLEOCIN) capsule 450 mg (has no administration in time range)     IMPRESSION / MDM / ASSESSMENT AND PLAN / ED COURSE  I reviewed the triage vital signs and the nursing notes.  Differential diagnosis includes, but is not limited to, dental abscess, fractured tooth, upper airway obstruction, Ludwig's angina  {Patient presents with symptoms of an acute illness or injury that is potentially life-threatening.  Patient presents with acute on chronic dental pain with possible superimposed infection but no discrete abscess and suitable for outpatient management with antibiotics and dental follow-up.      FINAL CLINICAL IMPRESSION(S) / ED DIAGNOSES   Final diagnoses:  Pain, dental  Dental caries  Dental infection     Rx / DC Orders   ED Discharge Orders          Ordered    clindamycin (CLEOCIN) 150 MG capsule  3 times daily        01/25/23 0109             Note:  This document was prepared using Dragon voice recognition software  and may include unintentional dictation errors.   Delton Prairie, MD 01/25/23 [phone]

## 2023-01-25 NOTE — ED Triage Notes (Signed)
Left Lower dental pain that radiates through the left ear. Taking ibuprofen for pain without relief. Pain for about a month, worse over 3 days.

## 2023-01-25 NOTE — Discharge Instructions (Addendum)
Please take Tylenol and ibuprofen/Advil for your pain.  It is safe to take them together, or to alternate them every few hours.  Take up to 1000mg of Tylenol at a time, up to 4 times per day.  Do not take more than 4000 mg of Tylenol in 24 hours.  For ibuprofen, take 400-600 mg, 3 - 4 times per day.  

## 2023-05-24 ENCOUNTER — Emergency Department: Payer: Self-pay

## 2023-05-24 ENCOUNTER — Other Ambulatory Visit: Payer: Self-pay

## 2023-05-24 ENCOUNTER — Emergency Department
Admission: EM | Admit: 2023-05-24 | Discharge: 2023-05-25 | Disposition: A | Discharge status: Home / Self Care | Payer: Self-pay | Attending: Emergency Medicine | Admitting: Emergency Medicine

## 2023-05-24 ENCOUNTER — Encounter: Payer: Self-pay | Admitting: Emergency Medicine

## 2023-05-24 DIAGNOSIS — Z20822 Contact with and (suspected) exposure to covid-19: Secondary | ICD-10-CM | POA: Insufficient documentation

## 2023-05-24 DIAGNOSIS — R0602 Shortness of breath: Secondary | ICD-10-CM

## 2023-05-24 DIAGNOSIS — J210 Acute bronchiolitis due to respiratory syncytial virus: Secondary | ICD-10-CM | POA: Insufficient documentation

## 2023-05-24 LAB — RESP PANEL BY RT-PCR (RSV, FLU A&B, COVID)  RVPGX2
Influenza A by PCR: NEGATIVE
Influenza B by PCR: NEGATIVE
Resp Syncytial Virus by PCR: POSITIVE — AB
SARS Coronavirus 2 by RT PCR: NEGATIVE

## 2023-05-24 MED ORDER — HYDROCOD POLI-CHLORPHE POLI ER 10-8 MG/5ML PO SUER: 5.0000 mL | Freq: Two times a day (BID) | ORAL | 0 refills | Status: AC | PRN

## 2023-05-24 MED ORDER — HYDROCOD POLI-CHLORPHE POLI ER 10-8 MG/5ML PO SUER
5.0000 mL | Freq: Once | ORAL | Status: AC
  Administered 2023-05-24: 5 mL via ORAL
  Filled 2023-05-24: qty 5

## 2023-05-24 MED ORDER — PREDNISONE 20 MG PO TABS
60.0000 mg | ORAL_TABLET | Freq: Once | ORAL | Status: AC
  Administered 2023-05-24: 60 mg via ORAL
  Filled 2023-05-24: qty 3

## 2023-05-24 MED ORDER — PREDNISONE 20 MG PO TABS: ORAL_TABLET | ORAL | 0 refills | Status: DC

## 2023-05-24 MED ORDER — IPRATROPIUM-ALBUTEROL 0.5-2.5 (3) MG/3ML IN SOLN
3.0000 mL | Freq: Once | RESPIRATORY_TRACT | Status: AC
  Administered 2023-05-24: 3 mL via RESPIRATORY_TRACT
  Filled 2023-05-24: qty 3

## 2023-05-24 MED ORDER — ALBUTEROL SULFATE (2.5 MG/3ML) 0.083% IN NEBU
2.5000 mg | INHALATION_SOLUTION | RESPIRATORY_TRACT | 0 refills | Status: DC | PRN
Start: 2023-05-24 — End: 2024-04-24

## 2023-05-24 MED ORDER — COMPRESSOR/NEBULIZER MISC: 1.0000 [IU] | 0 refills | Status: AC | PRN

## 2023-05-24 NOTE — ED Provider Notes (Signed)
[facility] Provider Note    Event Date/Time   First MD Initiated Contact with Patient 05/24/23 2305     (approximate)   History   Shortness of Breath   HPI  Rhonda Barrett is a 47 y.o. female who presents to the ED from home with a chief complaint of cough and shortness of breath over the past 5 days.  Seen at PCPs office today, received a nebulizer treatment and prescribed Augmentin plus azithromycin.  Denies associated fever/chills, chest pain, abdominal pain, nausea, vomiting or dizziness.     Past Medical History   Past Medical History:  Diagnosis Date   Anxiety    Panic attack      Active Problem List   Patient Active Problem List   Diagnosis Date Noted   Dysuria 01/26/2019   Intrauterine device surveillance 10/20/2018   Complex atypical endometrial hyperplasia 03/09/2017   Renal mass 09/23/2015     Past Surgical History   Past Surgical History:  Procedure Laterality Date   TUBAL LIGATION       Home Medications   Prior to Admission medications   Medication Sig Start Date End Date Taking? Authorizing Provider  cetirizine (ZYRTEC) 10 MG tablet Take 10 mg by mouth daily.    [provider]  citalopram (CELEXA) 20 MG tablet Take 20 mg by mouth daily.    [provider]  levonorgestrel (MIRENA) 20 MCG/24HR IUD 1 each by Intrauterine route once.    [provider]  metroNIDAZOLE (FLAGYL) 500 MG tablet Take 1 tablet (500 mg total) by mouth 2 (two) times daily. 09/27/19   Federico Flake, MD  phenazopyridine (PYRIDIUM) 200 MG tablet Take 1 tablet (200 mg total) by mouth 3 (three) times daily as needed for pain. 11/25/18   Joni Reining, PA-C  valACYclovir (VALTREX) 500 MG tablet Take 1,000 mg by mouth 3 (three) times daily. 08/11/19   [provider]     Allergies  Penicillins   Family History  History reviewed. No pertinent family history.   Physical Exam  Triage Vital Signs: ED Triage  Vitals  Encounter Vitals Group     BP 05/24/23 2204 (!) 169/88     Systolic BP Percentile --      Diastolic BP Percentile --      Pulse Rate 05/24/23 2204 93     Resp 05/24/23 2204 (!) 21     Temp 05/24/23 2204 99.3 F (37.4 C)     Temp Source 05/24/23 2204 Oral     SpO2 05/24/23 2204 96 %     Weight 05/24/23 2157 230 lb (104.3 kg)     Height 05/24/23 2157 5\' 6"  (1.676 m)     Head Circumference --      Peak Flow --      Pain Score 05/24/23 2157 0     Pain Loc --      Pain Education --      Exclude from Growth Chart --     Updated Vital Signs: BP (!) 169/88 (BP Location: Left Arm)   Pulse 93   Temp 99.3 F (37.4 C) (Oral)   Resp (!) 21   Ht 5\' 6"  (1.676 m)   Wt 104.3 kg   SpO2 96%   BMI 37.12 kg/m    General: Awake, no distress.  CV:  RRR.  Good peripheral perfusion.  Resp:  Normal effort.  Scattered wheezing. Abd:  Nontender.  No distention.  Other:  Bilateral calves  are supple and nontender.   ED Results / Procedures / Treatments  Labs (all labs ordered are listed, but only abnormal results are displayed) Labs Reviewed  RESP PANEL BY RT-PCR (RSV, FLU A&B, COVID)  RVPGX2 - Abnormal; Notable for the following components:      Result Value   Resp Syncytial Virus by PCR POSITIVE (*)    All other components within normal limits     EKG  ED ECG REPORT I, Yoshika Vensel J, the attending physician, personally viewed and interpreted this ECG.   Date: 05/24/2023  EKG Time: 2202  Rate: 96  Rhythm: normal sinus rhythm  Axis: Normal  Intervals:none  ST&T Change: Nonspecific    RADIOLOGY I have independently visualized and interpreted patient's imaging study as well as noted the radiology interpretation:  {You MUST document your own interpretation of imaging, as well as the fact that you reviewed the radiologist's report!:1}  Official radiology report(s): No results found.   PROCEDURES:  Critical Care performed: No  Procedures   MEDICATIONS ORDERED IN  ED: Medications  predniSONE (DELTASONE) tablet 60 mg (has no administration in time range)  ipratropium-albuterol (DUONEB) 0.5-2.5 (3) MG/3ML nebulizer solution 3 mL (has no administration in time range)  chlorpheniramine-HYDROcodone (TUSSIONEX) 10-8 MG/5ML suspension 5 mL (has no administration in time range)     IMPRESSION / MDM / ASSESSMENT AND PLAN / ED COURSE  I reviewed the triage vital signs and the nursing notes.                             47 year old female presenting for cough and shortness of breath. Differential includes, but is not limited to, viral syndrome, bronchitis including COPD exacerbation, pneumonia, reactive airway disease including asthma, CHF including exacerbation with or without pulmonary/interstitial edema, pneumothorax, ACS, thoracic trauma, and pulmonary embolism.  I personally reviewed patient's records and note a PCP office visit from 07/01/2022 for annual exam.  Patient's presentation is most consistent with acute complicated illness / injury requiring diagnostic workup.  Patient is RSV+.  Will start prednisone, administer Tussionex for cough and administer DuoNeb.  Will reassess.      FINAL CLINICAL IMPRESSION(S) / ED DIAGNOSES   Final diagnoses:  RSV (acute bronchiolitis due to respiratory syncytial virus)  SOB (shortness of breath)     Rx / DC Orders   ED Discharge Orders     None        Note:  This document was prepared using Dragon voice recognition software and may include unintentional dictation errors.

## 2023-05-24 NOTE — ED Triage Notes (Signed)
Pt c/o SOB with productive yellow cough over the past five day. Was seen at [facility] earlier today, given neb txs without relief. Reports concern for worsening SOB.  Requires spanish speaking interpreter

## 2023-05-24 NOTE — Discharge Instructions (Signed)
Take and finish steroid as prescribed.  You may use Tussionex as needed for cough.  You may use Albuterol inhaler or nebulizer every 4 hours as needed for difficulty breathing.  Return to the ER for worsening symptoms, persistent vomiting, difficulty breathing or other concerns.

## 2023-09-16 ENCOUNTER — Telehealth: Payer: Self-pay | Admitting: *Deleted

## 2023-09-17 ENCOUNTER — Inpatient Hospital Stay
Admission: RE | Admit: 2023-09-17 | Discharge: 2023-09-17 | Disposition: A | Discharge status: Home / Self Care | Payer: Self-pay | Source: Ambulatory Visit | Attending: Physician Assistant | Admitting: Physician Assistant

## 2023-09-17 ENCOUNTER — Other Ambulatory Visit: Payer: Self-pay | Admitting: *Deleted

## 2023-09-17 ENCOUNTER — Encounter: Payer: Self-pay | Admitting: Family Medicine

## 2023-09-17 DIAGNOSIS — Z1231 Encounter for screening mammogram for malignant neoplasm of breast: Secondary | ICD-10-CM

## 2023-10-08 ENCOUNTER — Ambulatory Visit

## 2023-10-20 ENCOUNTER — Telehealth: Payer: Self-pay | Admitting: *Deleted

## 2023-11-01 ENCOUNTER — Ambulatory Visit: Admitting: Nurse Practitioner

## 2023-11-01 ENCOUNTER — Other Ambulatory Visit: Payer: Self-pay | Admitting: Nurse Practitioner

## 2023-11-01 ENCOUNTER — Ambulatory Visit

## 2023-11-01 ENCOUNTER — Encounter: Payer: Self-pay | Admitting: Nurse Practitioner

## 2023-11-01 VITALS — BP 131/86 | HR 82 | Ht 66.0 in | Wt 273.0 lb

## 2023-11-01 DIAGNOSIS — Z113 Encounter for screening for infections with a predominantly sexual mode of transmission: Secondary | ICD-10-CM

## 2023-11-01 DIAGNOSIS — Z30017 Encounter for initial prescription of implantable subdermal contraceptive: Secondary | ICD-10-CM | POA: Diagnosis not present

## 2023-11-01 DIAGNOSIS — Z9851 Tubal ligation status: Secondary | ICD-10-CM | POA: Insufficient documentation

## 2023-11-01 DIAGNOSIS — Z3009 Encounter for other general counseling and advice on contraception: Secondary | ICD-10-CM | POA: Diagnosis not present

## 2023-11-01 DIAGNOSIS — Z30432 Encounter for removal of intrauterine contraceptive device: Secondary | ICD-10-CM

## 2023-11-01 DIAGNOSIS — F419 Anxiety disorder, unspecified: Secondary | ICD-10-CM

## 2023-11-01 DIAGNOSIS — F32A Depression, unspecified: Secondary | ICD-10-CM

## 2023-11-01 LAB — WET PREP FOR TRICH, YEAST, CLUE
Clue Cell Exam: NEGATIVE
Trichomonas Exam: NEGATIVE
Yeast Exam: NEGATIVE

## 2023-11-01 NOTE — Progress Notes (Signed)
 Pt here for acute visit to remove IUD which was placed approximately 8 years ago for assistance with heavy menses.  Has had BTL.  Wet mount results reviewed with patient.  No treatment needed at this time.  Condoms declined.-Ryun Velez, RN

## 2023-11-01 NOTE — Progress Notes (Signed)
 Patient seen in [facility] clinic today and requested referral for counseling due to self-reported anxiety and depression. Order for ambulatory referral placed to [facility], LCSW. Leary Provencal L. Nasim Garofano, FNP-C

## 2023-11-01 NOTE — Progress Notes (Signed)
 [facility] Department STI clinic [address] [place] [address] Main phone: [phone]  STI screening visit  Subjective:  Rhonda Barrett is a 48 y.o. female being seen today for an STI screening visit and IUD removal. Patient has had BTL and not a candidate for FP type visit. The patient reports they do have symptoms.  Patient reports that they do not desire a pregnancy in the next year.   They reported they are not interested in discussing contraception today.    No LMP recorded. (Menstrual status: Bilateral Tubal Ligation).  Patient has the following medical conditions:  Patient Active Problem List   Diagnosis Date Noted   Dysuria 01/26/2019   Intrauterine device surveillance 10/20/2018   Complex atypical endometrial hyperplasia 03/09/2017   Renal mass 09/23/2015   Chief Complaint  Patient presents with   Acute Visit    IUD removal    HPI Patient is a pleasant 48 y.o. female who presents to the office today requesting symptomatic STI testing. Patient reports since having IUD placed to control heavy menstrual bleeding she has started having an increase in what sounds like BV.  Patient indicates 1 female partner in the last 2 months. She reports practicing vaginal and oral sex and uses condoms sometimes. Patient reports last sex was yesterday. She indicates BTL 26 years ago as contraceptive method. Patient indicates no periods with IUD in place.   See flowsheet for further details and programmatic requirements Hyperlink available at the top of the signed note in blue.  Flow sheet content below:  Pregnancy Intention Screening Does the patient want to become pregnant in the next year?: No Does the patient's partner want to become pregnant in the next year?: No Would the patient like to discuss contraceptive options today?: No Contraception Wrap Up Current Method: IUD or IUS End Method: Female Sterilization Contraception Counseling Provided:  Yes How was the end contraceptive method provided?: N/A   Screening for MPX risk: Does the patient have an unexplained rash? No Is the patient MSM? No Does the patient endorse multiple sex partners or anonymous sex partners? No Did the patient have close or sexual contact with a person diagnosed with MPX? No Has the patient traveled outside the [place]  where MPX is endemic? No Is there a high clinical suspicion for MPX-- evidenced by one of the following No  -Unlikely to be chickenpox  -Lymphadenopathy  -Rash that present in same phase of evolution on any given body part  Screenings: Last HIV test per patient/review of record was No results found for: "HMHIVSCREEN" No results found for: "HIV"   Last HEPC test per patient/review of record was No results found for: "HMHEPCSCREEN" No components found for: "HEPC"   Last HEPB test per patient/review of record was No components found for: "HMHEPBSCREEN"   Patient reports last pap was:   No results found for: "SPECADGYN" Result Date Procedure Results Follow-ups  09/24/2014 Pap IG w/ reflex to HPV when ASC-U      Immunization history:   There is no immunization history on file for this patient.  The following portions of the patient's history were reviewed and updated as appropriate: allergies, current medications, past medical history, past social history, past surgical history and problem list.  Objective:   Vitals:   11/01/23 1300  BP: 131/86  Pulse: 82  Weight: 273 lb (123.8 kg)  Height: 5\' 6"  (1.676 m)    Physical Exam Nursing note reviewed. Exam conducted with a chaperone  present Aubrey Blas, RN present as chaperone.).  Constitutional:      Appearance: Normal appearance.  HENT:     Head: Normocephalic.     Salivary Glands: Right salivary gland is not diffusely enlarged or tender. Left salivary gland is not diffusely enlarged or tender.     Mouth/Throat:     Lips: Pink. No lesions.     Mouth: Mucous membranes are  moist.     Tongue: No lesions. Tongue does not deviate from midline.     Pharynx: Oropharynx is clear. Uvula midline.     Tonsils: No tonsillar exudate.  Eyes:     General:        Right eye: No discharge.        Left eye: No discharge.  Neck:     Trachea: Phonation normal.  Pulmonary:     Effort: Pulmonary effort is normal.  Genitourinary:    General: Normal vulva.     Exam position: Lithotomy position.     Pubic Area: No rash or pubic lice.      Tanner stage (genital): 5.     Labia:        Right: No rash, tenderness, lesion or injury.        Left: No rash, tenderness, lesion or injury.      Vagina: Normal. No vaginal discharge, erythema, tenderness, bleeding or lesions.     Cervix: Normal. No cervical motion tenderness, discharge, friability, lesion, erythema, cervical bleeding or eversion.     Uterus: Normal.      Adnexa: Right adnexa normal and left adnexa normal.        Comments: pH>4.5 IUD strings visualized from cervical os and IUD removed without complication.  Lymphadenopathy:     Head:     Right side of head: No submental, submandibular, tonsillar, preauricular or posterior auricular adenopathy.     Left side of head: No submental, submandibular, tonsillar, preauricular or posterior auricular adenopathy.     Cervical: No cervical adenopathy.     Right cervical: No superficial or posterior cervical adenopathy.    Left cervical: No superficial or posterior cervical adenopathy.     Upper Body:     Right upper body: No supraclavicular or axillary adenopathy.     Left upper body: No supraclavicular or axillary adenopathy.     Lower Body: No right inguinal adenopathy. No left inguinal adenopathy.  Skin:    General: Skin is warm and dry.     Findings: No lesion or rash.     Comments: Skin tone appropriate for ethnicity.   Neurological:     Mental Status: She is alert and oriented to person, place, and time.  Psychiatric:        Attention and Perception: Attention and  perception normal.        Mood and Affect: Mood and affect normal.        Speech: Speech normal.        Behavior: Behavior normal. Behavior is cooperative.        Thought Content: Thought content normal.     Assessment and Plan:  [name] is a 48 y.o. female presenting to the [facility] Department for STI screening  1. Encounter for IUD removal (Primary) Procedure:  IUD Removal  Patient identified, informed consent performed, consent signed.  Patient was in the dorsal lithotomy position, normal external genitalia was noted.  A speculum was placed in the patient's vagina, normal discharge was noted, no lesions. The cervix was  visualized, no lesions, no abnormal discharge.  The strings of the IUD were grasped and pulled using ring forceps. The IUD was removed in its entirety.  Patient tolerated the procedure well.   Patient has had BTL and not able to conceive.  Routine preventative health maintenance measures emphasized.  Advised patient since she has not been having a menstrual period we cannot be sure if she has reached menopause yet. Advised now that IUD has been removed she may have periods again. Pt verbalized understanding.   2. Screening for venereal disease Wet prep negative in office today.  - WET PREP FOR TRICH, YEAST, CLUE - Gonococcus culture  Patient accepted the following screenings: oral GC culture and vaginal wet prep Patient meets criteria for HepB screening? Yes. Ordered? No-Patient declined. Patient meets criteria for HepC screening? Yes. Ordered? No-Patient declined.   Treat wet prep per standing order Discussed time line for State Lab results and that patient will be called with positive results and encouraged patient to call if she had not heard in 2 weeks.  Counseled to return or seek care for continued or worsening symptoms Recommended repeat testing in 3 months with positive results. Recommended condom use with all sex for STI prevention.    Patient is currently using Postpartum Sterilization to prevent pregnancy.    Return if symptoms worsen or fail to improve, for STI screening.  No future appointments.  Total time with patient 45 minutes.   Merleen Stare, NP

## 2023-11-01 NOTE — Progress Notes (Deleted)
 [facility] Problem Visit  [facility]  Subjective:  Rhonda Barrett is a 48 y.o. being seen today for IUD removal. Patient is   Chief Complaint  Patient presents with  . Acute Visit    IUD removal    HPI   Does the patient have a current or past history of drug use? {yes/no:20286}   No components found for: "HCV"]   Health Maintenance Due  Topic Date Due  . COVID-19 Vaccine (1) Never done  . HIV Screening  Never done  . Hepatitis C Screening  Never done  . DTaP/Tdap/Td (1 - Tdap) Never done  . Colonoscopy  Never done  . Cervical Cancer Screening (HPV/Pap Cotest)  03/09/2022    ROS  The following portions of the patient's history were reviewed and updated as appropriate: allergies, current medications, past family history, past medical history, past social history, past surgical history and problem list. Problem list updated.   See flowsheet for other program required questions.  Objective:   Vitals:   11/01/23 1300  BP: 131/86  Pulse: 82  Weight: 273 lb (123.8 kg)  Height: 5\' 6"  (1.676 m)    Physical Exam    Assessment and Plan:  Rhonda Barrett is a 48 y.o. female presenting to the [facility] Department for a Women's Health problem visit  1. Encounter for IUD removal (Primary) ***     Return for STI screening.  Future Appointments  Date Time Provider Department Center  11/01/2023  1:50 PM [facility] PROVIDER [facility] None    Merleen Stare, NP

## 2023-11-06 LAB — GONOCOCCUS CULTURE

## 2023-11-20 ENCOUNTER — Emergency Department
Admission: EM | Admit: 2023-11-20 | Discharge: 2023-11-20 | Disposition: A | Discharge status: Home / Self Care | Payer: Self-pay

## 2023-11-20 ENCOUNTER — Other Ambulatory Visit: Payer: Self-pay

## 2023-11-20 DIAGNOSIS — E876 Hypokalemia: Secondary | ICD-10-CM | POA: Insufficient documentation

## 2023-11-20 DIAGNOSIS — R55 Syncope and collapse: Secondary | ICD-10-CM | POA: Insufficient documentation

## 2023-11-20 LAB — CBC WITH DIFFERENTIAL/PLATELET
Abs Immature Granulocytes: 0.02 10*3/uL (ref 0.00–0.07)
Basophils Absolute: 0 10*3/uL (ref 0.0–0.1)
Basophils Relative: 1 %
Eosinophils Absolute: 0.3 10*3/uL (ref 0.0–0.5)
Eosinophils Relative: 4 %
HCT: 32 % — ABNORMAL LOW (ref 36.0–46.0)
Hemoglobin: 9.9 g/dL — ABNORMAL LOW (ref 12.0–15.0)
Immature Granulocytes: 0 %
Lymphocytes Relative: 30 %
Lymphs Abs: 2 10*3/uL (ref 0.7–4.0)
MCH: 22.4 pg — ABNORMAL LOW (ref 26.0–34.0)
MCHC: 30.9 g/dL (ref 30.0–36.0)
MCV: 72.4 fL — ABNORMAL LOW (ref 80.0–100.0)
Monocytes Absolute: 0.6 10*3/uL (ref 0.1–1.0)
Monocytes Relative: 8 %
Neutro Abs: 3.8 10*3/uL (ref 1.7–7.7)
Neutrophils Relative %: 57 %
Platelets: 310 10*3/uL (ref 150–400)
RBC: 4.42 MIL/uL (ref 3.87–5.11)
RDW: 15.9 % — ABNORMAL HIGH (ref 11.5–15.5)
WBC: 6.7 10*3/uL (ref 4.0–10.5)
nRBC: 0 % (ref 0.0–0.2)

## 2023-11-20 LAB — URINALYSIS, COMPLETE (UACMP) WITH MICROSCOPIC
Bacteria, UA: NONE SEEN
Bilirubin Urine: NEGATIVE
Glucose, UA: NEGATIVE mg/dL
Hgb urine dipstick: NEGATIVE
Ketones, ur: NEGATIVE mg/dL
Leukocytes,Ua: NEGATIVE
Nitrite: NEGATIVE
Protein, ur: 30 mg/dL — AB
Specific Gravity, Urine: 1.012 (ref 1.005–1.030)
pH: 7 (ref 5.0–8.0)

## 2023-11-20 LAB — BASIC METABOLIC PANEL WITH GFR
Anion gap: 8 (ref 5–15)
BUN: 9 mg/dL (ref 6–20)
CO2: 25 mmol/L (ref 22–32)
Calcium: 8.2 mg/dL — ABNORMAL LOW (ref 8.9–10.3)
Chloride: 103 mmol/L (ref 98–111)
Creatinine, Ser: 0.52 mg/dL (ref 0.44–1.00)
GFR, Estimated: >60 mL/min (ref >60)
Glucose, Bld: 104 mg/dL — ABNORMAL HIGH (ref 70–99)
Potassium: 2.8 mmol/L — ABNORMAL LOW (ref 3.5–5.1)
Sodium: 136 mmol/L (ref 135–145)

## 2023-11-20 LAB — TROPONIN I (HIGH SENSITIVITY)
Troponin I (High Sensitivity): 3 ng/L (ref <18)
Troponin I (High Sensitivity): 3 ng/L (ref <18)

## 2023-11-20 MED ORDER — SODIUM CHLORIDE 0.9 % IV BOLUS
500.0000 mL | Freq: Once | INTRAVENOUS | Status: AC
  Administered 2023-11-20: 500 mL via INTRAVENOUS

## 2023-11-20 MED ORDER — POTASSIUM CHLORIDE 20 MEQ PO PACK
40.0000 meq | PACK | Freq: Once | ORAL | Status: AC
  Administered 2023-11-20: 40 meq via ORAL
  Filled 2023-11-20: qty 2

## 2023-11-20 MED ORDER — POTASSIUM CHLORIDE 10 MEQ/100ML IV SOLN
10.0000 meq | Freq: Once | INTRAVENOUS | Status: AC
  Administered 2023-11-20: 10 meq via INTRAVENOUS
  Filled 2023-11-20: qty 100

## 2023-11-20 NOTE — ED Triage Notes (Signed)
 BIB EMS for a syncopal episode after patient reports she was walking through her home when she became dizzy and then lost consciousness. Patient denies hitting her head or being on blood thinners.

## 2023-11-20 NOTE — Discharge Instructions (Addendum)
 Your evaluation in the emergency department was overall reassuring.  Your potassium level was somewhat low, and this was repleted here.  Your heart workup showed no abnormalities.  Please do follow-up with your primary care provider for reevaluation, and return to the emergency department with any new or worsening symptoms.

## 2023-11-20 NOTE — ED Provider Notes (Addendum)
 [facility] Provider Note    Event Date/Time   First MD Initiated Contact with Patient 11/20/23 0701     (approximate)   History   Loss of Consciousness  BIB EMS for a syncopal episode after patient reports she was walking through her home when she became dizzy and then lost consciousness. Patient denies hitting her head or being on blood thinners.    HPI Rhonda Barrett is a 48 y.o. female PMH anxiety, prediabetes, obesity presents for evaluation after loss of consciousness episode -Patient states shortly after getting up from bed she was walking in her home she felt very lightheaded and warm along with palpitations.  Subsequently had a loss of consciousness episode in which she fell to the carpeted floor.  Witnessed by her friend, was reportedly a few minutes.  No convulsions.  No pain after episode.  Called an ambulance which she walked to just outside of her home and presents to emergency department for eval. -Otherwise has been in her usual state of health.  No chest pain, shortness of breath, abdominal pain, nausea/vomiting/diarrhea.  No black or bloody stools. -No recent surgery/stasis/travel, no history of DVT/PE, no leg swelling, no hormone use     Physical Exam   Triage Vital Signs: ED Triage Vitals  Encounter Vitals Group     BP 11/20/23 0642 117/66     Girls Systolic BP Percentile --      Girls Diastolic BP Percentile --      Boys Systolic BP Percentile --      Boys Diastolic BP Percentile --      Pulse Rate 11/20/23 0638 91     Resp 11/20/23 0638 16     Temp 11/20/23 0638 97.9 F (36.6 C)     Temp Source 11/20/23 0638 Oral     SpO2 11/20/23 0638 98 %     Weight --      Height --      Head Circumference --      Peak Flow --      Pain Score 11/20/23 0639 0     Pain Loc --      Pain Education --      Exclude from Growth Chart --     Most recent vital signs: Vitals:   11/20/23 0721 11/20/23 1108  BP: (!) 123/58 121/69  Pulse: 88 81   Resp: 19 (!) 21  Temp:  98.1 F (36.7 C)  SpO2: 98% 100%     General: Awake, no distress.  HEENT: Normocephalic, atraumatic CV:  Good peripheral perfusion. RRR, RP 2+ Resp:  Normal effort. CTAB Abd:  No distention. Nontender to deep palpation throughout Ext:  No ttp throughout extremities   ED Results / Procedures / Treatments   Labs (all labs ordered are listed, but only abnormal results are displayed) Labs Reviewed  CBC WITH DIFFERENTIAL/PLATELET - Abnormal; Notable for the following components:      Result Value   Hemoglobin 9.9 (*)    HCT 32.0 (*)    MCV 72.4 (*)    MCH 22.4 (*)    RDW 15.9 (*)    All other components within normal limits  BASIC METABOLIC PANEL WITH GFR - Abnormal; Notable for the following components:   Potassium 2.8 (*)    Glucose, Bld 104 (*)    Calcium 8.2 (*)    All other components within normal limits  URINALYSIS, COMPLETE (UACMP) WITH MICROSCOPIC - Abnormal; Notable for the following components:  Color, Urine YELLOW (*)    APPearance CLOUDY (*)    Protein, ur 30 (*)    All other components within normal limits  TROPONIN I (HIGH SENSITIVITY)  TROPONIN I (HIGH SENSITIVITY)     EKG  See ED course.    RADIOLOGY N/a    PROCEDURES:  Critical Care performed: No  Procedures   MEDICATIONS ORDERED IN ED: Medications  sodium chloride  0.9 % bolus 500 mL (0 mLs Intravenous Stopped 11/20/23 0917)  potassium chloride  (KLOR-CON ) packet 40 mEq (40 mEq Oral Given 11/20/23 0801)  potassium chloride  10 mEq in 100 mL IVPB (0 mEq Intravenous Stopped 11/20/23 0917)     IMPRESSION / MDM / ASSESSMENT AND PLAN / ED COURSE  I reviewed the triage vital signs and the nursing notes.                              DDX/MDM/AP: Differential diagnosis includes, but is not limited to, vasovagal episode, possible orthostasis, consider transient arrhythmia, do not suspect PE (PERC negative).  Doubt underlying infection though will screen for  UTI.  Plan: - Labs - EKG - Small bolus IV fluid - Cardiac monitor - Reassess  Patient's presentation is most consistent with acute presentation with potential threat to life or bodily function.  The patient is on the cardiac monitor to evaluate for evidence of arrhythmia and/or significant heart rate changes.  ED course below.  Workup unremarkable including serial troponins.  Did have moderate hypokalemia to 2.8, no associated EKG changes on my eval, repleted with 40 meq p.o. and 10meq IV.  Overall considered likely vasovagal episode, consider possibility of transient arrhythmia though no not a clear etiology here on monitor.  Recommend PMD follow-up.  Did also offer cardiology referral though patient declined which I believe is reasonable.  Strict ED return precautions in place.  Patient agrees with plan.  Clinical Course as of 11/20/23 1109  Sat Nov 20, 2023  0703 Ecg = sinus rhythm, rate 91, no gross ST elevation or depression, no suspected repolarization abnormality, normal axis, normal intervals.  No clear evidence of ischemia nor arrhythmia on my interpretation. [MM]  0712 CBC with mild anemia, comparison 4 years ago with a drop of about 3 points  Patient denies any black or bloody stools, recurrent vaginal bleeding, no hematemesis or other source of bleeding [MM]  0742 Bmp w/ moderate hypokalemia, will replete po  Does not appear to be on any culprit medications  No associated EKG changes [MM]  0743 Trop wnl [MM]  0858 UA w/ no clear e/o infxn [MM]  1103 Rpt trop stable Do not suspect ACS Potassium complete Will proceed w/ DC home [MM]    Clinical Course User Index [MM] Clarine Ozell LABOR, MD     FINAL CLINICAL IMPRESSION(S) / ED DIAGNOSES   Final diagnoses:  Syncope, unspecified syncope type  Hypokalemia     Rx / DC Orders   ED Discharge Orders     None        Note:  This document was prepared using Dragon voice recognition software and may include  unintentional dictation errors.   Clarine Ozell LABOR, MD 11/20/23 1107    Clarine Ozell LABOR, MD 11/20/23 [phone]

## 2024-04-01 ENCOUNTER — Emergency Department
Admission: EM | Admit: 2024-04-01 | Discharge: 2024-04-01 | Disposition: A | Discharge status: Home / Self Care | Payer: Self-pay | Attending: Emergency Medicine | Admitting: Emergency Medicine

## 2024-04-01 ENCOUNTER — Encounter: Payer: Self-pay | Admitting: Emergency Medicine

## 2024-04-01 DIAGNOSIS — R059 Cough, unspecified: Secondary | ICD-10-CM | POA: Insufficient documentation

## 2024-04-01 DIAGNOSIS — J4521 Mild intermittent asthma with (acute) exacerbation: Secondary | ICD-10-CM | POA: Insufficient documentation

## 2024-04-01 LAB — GROUP A STREP BY PCR: Group A Strep by PCR: NOT DETECTED

## 2024-04-01 LAB — RESP PANEL BY RT-PCR (RSV, FLU A&B, COVID)  RVPGX2
Influenza A by PCR: NEGATIVE
Influenza B by PCR: NEGATIVE
Resp Syncytial Virus by PCR: NEGATIVE
SARS Coronavirus 2 by RT PCR: NEGATIVE

## 2024-04-01 MED ORDER — IPRATROPIUM-ALBUTEROL 0.5-2.5 (3) MG/3ML IN SOLN
3.0000 mL | Freq: Once | RESPIRATORY_TRACT | Status: AC
  Administered 2024-04-01: 3 mL via RESPIRATORY_TRACT
  Filled 2024-04-01: qty 3

## 2024-04-01 MED ORDER — PREDNISONE 50 MG PO TABS: 50.0000 mg | ORAL_TABLET | Freq: Every day | ORAL | 0 refills | Status: AC

## 2024-04-01 MED ORDER — PREDNISONE 20 MG PO TABS
60.0000 mg | ORAL_TABLET | Freq: Once | ORAL | Status: AC
  Administered 2024-04-01: 60 mg via ORAL
  Filled 2024-04-01: qty 3

## 2024-04-01 MED ORDER — PSEUDOEPH-BROMPHEN-DM 30-2-10 MG/5ML PO SYRP
5.0000 mL | ORAL_SOLUTION | Freq: Four times a day (QID) | ORAL | 0 refills | Status: AC | PRN
Start: 2024-04-01 — End: ?

## 2024-04-01 MED ORDER — PREDNISONE 50 MG PO TABS: 50.0000 mg | ORAL_TABLET | Freq: Every day | ORAL | 0 refills | Status: DC

## 2024-04-01 NOTE — ED Provider Notes (Signed)
 [facility] Provider Note    Event Date/Time   First MD Initiated Contact with Patient 04/01/24 2237     (approximate)   History   Sore Throat    HPI  Rhonda Barrett is a 48 y.o. female    with a past medical history of gross hematuria, anxiety, goiter, who presents to the ED complaining of sore throat. According to the patient, symptoms started 6 weeks ago with productive cough, wheezing, sore throat shortness of breath.  Patient denies fever, vomiting, abdominal pain.  Patient works cutting trees.  Patient had previous episodes like these during the wintertime.  Patient is using albuterol  inhaler without resolution of the symptoms.  Patient was seen by her PCP who prescribed cough Perles and cough syrup without resolution of the symptoms.  Patient states cough is producing urinary incontinence.  Patient is here by herself.     Patient Active Problem List   Diagnosis Date Noted   History of bilateral tubal ligation 11/01/2023   Dysuria 01/26/2019   Complex atypical endometrial hyperplasia 03/09/2017   Renal mass 09/23/2015    Physical Exam   Triage Vital Signs: ED Triage Vitals [04/01/24 2213]  Encounter Vitals Group     BP (!) 143/74     Girls Systolic BP Percentile      Girls Diastolic BP Percentile      Boys Systolic BP Percentile      Boys Diastolic BP Percentile      Pulse Rate 93     Resp 18     Temp 98.3 F (36.8 C)     Temp Source Oral     SpO2 100 %     Weight      Height      Head Circumference      Peak Flow      Pain Score      Pain Loc      Pain Education      Exclude from Growth Chart     Most recent vital signs: Vitals:   04/01/24 2213  BP: (!) 143/74  Pulse: 93  Resp: 18  Temp: 98.3 F (36.8 C)  SpO2: 100%     Physical Exam Vitals and nursing note reviewed.  During triage patient was hypertensive  General:          Awake, no distress.  During physical exam patient was actively coughing, cough was dry. Mouth:  Posterior nasal drip, tonsillar erythema.  No exudates, no petechia. Ears: Bilateral otoscopy within normal limits CV:                  Good peripheral perfusion.  Resp:               Normal effort. no tachypnea no wheezing, mobilization of secretions.  No rales or crackles. Abd:                 No distention.  Soft nontender Other:               ED Results / Procedures / Treatments   Labs (all labs ordered are listed, but only abnormal results are displayed) Labs Reviewed  GROUP A STREP BY PCR  RESP PANEL BY RT-PCR (RSV, FLU A&B, COVID)  RVPGX2       PROCEDURES:  Critical Care performed:   Procedures   MEDICATIONS ORDERED IN ED: Medications  ipratropium-albuterol  (DUONEB) 0.5-2.5 (3) MG/3ML nebulizer solution 3 mL (3 mLs Nebulization Given 04/01/24 2308)  predniSONE  (DELTASONE ) tablet 60 mg (60 mg Oral Given 04/01/24 2308)   Clinical Course as of 04/01/24 2321  Sat Apr 01, 2024  2302 Group A Strep by PCR [facility] Only) Negative [AE]  2312 Resp panel by RT-PCR (RSV, Flu A&B, Covid) Throat Negative [AE]  2319 Reassessed the patient after getting DuoNeb treatment and prednisone .  Updated patient with results of respiratory panel and group A strep.  Patient is ready for discharge with prednisone , cetirizine, albuterol  inhaler.  Patient is agreeable with the plan [AE]    Clinical Course User Index [AE] Janit Kast, PA-C    IMPRESSION / MDM / ASSESSMENT AND PLAN / ED COURSE  I reviewed the triage vital signs and the nursing notes.  Differential diagnosis includes, but is not limited to, asthma exacerbation, strep A, COVID, viral upper respiratory infection, unlikely pneumonia  Patient's presentation is most consistent with acute complicated illness / injury requiring diagnostic workup.   Rhonda Barrett is a 48 y.o., female who presents today with history of 6 weeks of productive cough, shortness of breath, sore throat.  On a physical exam patient was actively coughing.  No  signs of difficulty breathing, no use of accessory muscles.  At auscultation, evidence of mobilization of secretions bibasal.  No wheezing, no crackles, no rales.  Mouth with presence of posterior nasal drip, tonsillar erythema, no tonsillar enlargement, no petechia, no exudates.  Rest of physical exam is normal Plan Prednisone  DuoNeb Reassess Reassessed the patient after having treatment DuoNeb prednisone .  Patient is ready for discharge. Patient's diagnosis is consistent with asthma exacerbation. I did not order any imaging, physical exam is reassuring labs are  reassuring ruling out strep a, COVID, flu, RSV. I did review the patient's allergies and medications.The patient is in stable and satisfactory condition for discharge home  Patient will be discharged home with prescriptions for prednisone , cough syrup, cetirizine.  I did advise patient to take plenty of fluids.  Patient is to follow up with PCP as needed or otherwise directed. Patient is given ED precautions to return to the ED for any worsening or new symptoms. Discussed plan of care with patient, answered all of patient's questions, Patient agreeable to plan of care. Advised patient to take medications according to the instructions on the label. Discussed possible side effects of new medications. Patient verbalized understanding.  FINAL CLINICAL IMPRESSION(S) / ED DIAGNOSES   Final diagnoses:  Mild intermittent asthma with exacerbation     Rx / DC Orders   ED Discharge Orders          Ordered    predniSONE  (DELTASONE ) 50 MG tablet  Daily with breakfast,   Status:  Discontinued        04/01/24 2312    brompheniramine-pseudoephedrine-DM 30-2-10 MG/5ML syrup  4 times daily PRN        04/01/24 2312    predniSONE  (DELTASONE ) 50 MG tablet  Daily with breakfast        04/01/24 2321             Note:  This document was prepared using Dragon voice recognition software and may include unintentional dictation errors.   Janit Kast, PA-C 04/01/24 2321    Dicky Anes, MD 04/02/24 [phone]

## 2024-04-01 NOTE — Discharge Instructions (Signed)
 You have been diagnosed with asthma exacerbation.  Please use your albuterol  inhaler every 4 hours as needed for wheezing.  Please take prednisone  1 tablet by mouth for the next 4 days.  Please take cetirizine 1 tablet daily.  Please take cough syrup every 6 hours as needed for cough.  Take cough syrup prescribed by your PCP only at night.  Please drink plenty fluids.  Come back to ED or go to your PCP if you have new symptoms symptoms worsen.  It was a pleasure to help you today.  Lorane Paula, PA-C

## 2024-04-01 NOTE — ED Triage Notes (Signed)
 Sore throat and cough for 6 weeks. Went to see Doc for cough and got cough medicine. She states she has white patches on side.

## 2024-04-24 ENCOUNTER — Other Ambulatory Visit: Payer: Self-pay

## 2024-04-24 ENCOUNTER — Emergency Department: Payer: Self-pay

## 2024-04-24 ENCOUNTER — Emergency Department
Admission: EM | Admit: 2024-04-24 | Discharge: 2024-04-24 | Disposition: A | Discharge status: Home / Self Care | Payer: Self-pay | Attending: Emergency Medicine | Admitting: Emergency Medicine

## 2024-04-24 DIAGNOSIS — J40 Bronchitis, not specified as acute or chronic: Secondary | ICD-10-CM | POA: Insufficient documentation

## 2024-04-24 DIAGNOSIS — J45909 Unspecified asthma, uncomplicated: Secondary | ICD-10-CM | POA: Insufficient documentation

## 2024-04-24 LAB — RESP PANEL BY RT-PCR (RSV, FLU A&B, COVID)  RVPGX2
Influenza A by PCR: NEGATIVE
Influenza B by PCR: NEGATIVE
Resp Syncytial Virus by PCR: NEGATIVE
SARS Coronavirus 2 by RT PCR: NEGATIVE

## 2024-04-24 MED ORDER — ALBUTEROL SULFATE (2.5 MG/3ML) 0.083% IN NEBU: 2.5000 mg | INHALATION_SOLUTION | RESPIRATORY_TRACT | 0 refills | Status: AC | PRN

## 2024-04-24 NOTE — ED Provider Notes (Signed)
 [facility] Provider Note    Event Date/Time   First MD Initiated Contact with Patient 04/24/24 [phone]     (approximate)   History   Chills   HPI  Rhonda Barrett is a 48 y.o. female who presents to the emergency department today with continued concern for chills and cough.  She states that symptoms have been present for the past few weeks and she has been seen in the emergency department as well as by primary care for this.  Came in today because she also felt like her eyes were a little red.  She denies any eye pain with this.  She has also been having some headaches.  She has been having she states she does have a history of asthma.  She denies any fevers.  Denies any chest pain.     Physical Exam   Triage Vital Signs: ED Triage Vitals [04/24/24 0200]  Encounter Vitals Group     BP (!) 154/94     Girls Systolic BP Percentile      Girls Diastolic BP Percentile      Boys Systolic BP Percentile      Boys Diastolic BP Percentile      Pulse Rate 92     Resp 16     Temp 97.9 F (36.6 C)     Temp Source Oral     SpO2 99 %     Weight      Height 5' 6 (1.676 m)     Head Circumference      Peak Flow      Pain Score 8     Pain Loc      Pain Education      Exclude from Growth Chart     Most recent vital signs: Vitals:   04/24/24 0200  BP: (!) 154/94  Pulse: 92  Resp: 16  Temp: 97.9 F (36.6 C)  SpO2: 99%   General: Awake, alert, oriented. CV:  Good peripheral perfusion. Regular rate and rhythm. Resp:  Normal effort. Lungs clear. Abd:  No distention. Non tender. Eyes:  No significant injection. No hemorrhage  ED Results / Procedures / Treatments   Labs (all labs ordered are listed, but only abnormal results are displayed) Labs Reviewed  RESP PANEL BY RT-PCR (RSV, FLU A&B, COVID)  RVPGX2     EKG  None   RADIOLOGY I independently interpreted and visualized the CXR. My interpretation: No pneumonia Radiology interpretation:   IMPRESSION:  1. No acute cardiopulmonary process.    PROCEDURES:  Critical Care performed: No    MEDICATIONS ORDERED IN ED: Medications - No data to display   IMPRESSION / MDM / ASSESSMENT AND PLAN / ED COURSE  I reviewed the triage vital signs and the nursing notes.                              Differential diagnosis includes, but is not limited to, pneumonia, bronchitis, asthma  Patient's presentation is most consistent with acute presentation with potential threat to life or bodily function.   Patient presented to the emergency department today because of concerns for continued cough, chills.  Also concern for redness to her eyes.  On exam patient without any significant redness to her eyes.  Her lungs were clear.  Did obtain a chest x-ray to evaluate for possible pneumonia.  This was negative.  At this time I think likely bronchitis.  Patient  no respiratory distress.  Will give patient prescription for albuterol  for her nebulizer machine.  Encourage patient to continue follow-up with primary care.      FINAL CLINICAL IMPRESSION(S) / ED DIAGNOSES   Final diagnoses:  Bronchitis      Note:  This document was prepared using Dragon voice recognition software and may include unintentional dictation errors.    Floy Roberts, MD 04/24/24 [phone]

## 2024-04-24 NOTE — ED Triage Notes (Signed)
 Pt to ed from home via POV for chills, headache and red eyes. Pt is caox4, in no acute distress and ambulatory to triage.
